# Patient Record
Sex: Female | Born: 1960 | Race: White | Hispanic: No | Marital: Married | Smoking: Former smoker
Health system: Southern US, Community
[De-identification: ages and names within clinical notes are randomized; demographics above are authoritative.]

## PROBLEM LIST (undated history)

## (undated) DIAGNOSIS — E785 Hyperlipidemia, unspecified: Secondary | ICD-10-CM

## (undated) DIAGNOSIS — M1990 Unspecified osteoarthritis, unspecified site: Secondary | ICD-10-CM

## (undated) DIAGNOSIS — R739 Hyperglycemia, unspecified: Secondary | ICD-10-CM

## (undated) DIAGNOSIS — M549 Dorsalgia, unspecified: Secondary | ICD-10-CM

## (undated) DIAGNOSIS — K5900 Constipation, unspecified: Secondary | ICD-10-CM

## (undated) DIAGNOSIS — E039 Hypothyroidism, unspecified: Secondary | ICD-10-CM

## (undated) DIAGNOSIS — E079 Disorder of thyroid, unspecified: Secondary | ICD-10-CM

## (undated) DIAGNOSIS — K219 Gastro-esophageal reflux disease without esophagitis: Secondary | ICD-10-CM

## (undated) DIAGNOSIS — F419 Anxiety disorder, unspecified: Secondary | ICD-10-CM

## (undated) DIAGNOSIS — M7989 Other specified soft tissue disorders: Secondary | ICD-10-CM

## (undated) DIAGNOSIS — M2550 Pain in unspecified joint: Secondary | ICD-10-CM

## (undated) DIAGNOSIS — E559 Vitamin D deficiency, unspecified: Secondary | ICD-10-CM

## (undated) DIAGNOSIS — F32A Depression, unspecified: Secondary | ICD-10-CM

## (undated) DIAGNOSIS — R5383 Other fatigue: Secondary | ICD-10-CM

## (undated) HISTORY — DX: Other fatigue: R53.83

## (undated) HISTORY — DX: Other specified soft tissue disorders: M79.89

## (undated) HISTORY — PX: ROUX-EN-Y PROCEDURE: SUR1287

## (undated) HISTORY — DX: Pain in unspecified joint: M25.50

## (undated) HISTORY — DX: Constipation, unspecified: K59.00

## (undated) HISTORY — DX: Hyperlipidemia, unspecified: E78.5

## (undated) HISTORY — DX: Gastro-esophageal reflux disease without esophagitis: K21.9

## (undated) HISTORY — PX: CHOLECYSTECTOMY: SHX55

## (undated) HISTORY — DX: Hyperglycemia, unspecified: R73.9

## (undated) HISTORY — DX: Hypothyroidism, unspecified: E03.9

## (undated) HISTORY — PX: OTHER SURGICAL HISTORY: SHX169

## (undated) HISTORY — DX: Depression, unspecified: F32.A

## (undated) HISTORY — DX: Anxiety disorder, unspecified: F41.9

## (undated) HISTORY — DX: Dorsalgia, unspecified: M54.9

## (undated) HISTORY — DX: Vitamin D deficiency, unspecified: E55.9

---

## 2014-02-04 HISTORY — PX: FOOT SURGERY: SHX648

## 2015-02-05 HISTORY — PX: OTHER SURGICAL HISTORY: SHX169

## 2020-10-23 ENCOUNTER — Emergency Department (HOSPITAL_COMMUNITY)
Admission: EM | Admit: 2020-10-23 | Discharge: 2020-10-23 | Disposition: A | Discharge status: Home / Self Care | Payer: BC Managed Care – PPO | Attending: Emergency Medicine | Admitting: Emergency Medicine

## 2020-10-23 ENCOUNTER — Other Ambulatory Visit: Payer: Self-pay

## 2020-10-23 ENCOUNTER — Encounter (HOSPITAL_COMMUNITY): Payer: Self-pay

## 2020-10-23 ENCOUNTER — Emergency Department (HOSPITAL_COMMUNITY): Payer: BC Managed Care – PPO

## 2020-10-23 DIAGNOSIS — Z87891 Personal history of nicotine dependence: Secondary | ICD-10-CM | POA: Insufficient documentation

## 2020-10-23 DIAGNOSIS — S0990XA Unspecified injury of head, initial encounter: Secondary | ICD-10-CM | POA: Diagnosis not present

## 2020-10-23 DIAGNOSIS — M549 Dorsalgia, unspecified: Secondary | ICD-10-CM | POA: Diagnosis not present

## 2020-10-23 DIAGNOSIS — Y9259 Other trade areas as the place of occurrence of the external cause: Secondary | ICD-10-CM | POA: Insufficient documentation

## 2020-10-23 DIAGNOSIS — M542 Cervicalgia: Secondary | ICD-10-CM | POA: Insufficient documentation

## 2020-10-23 DIAGNOSIS — M546 Pain in thoracic spine: Secondary | ICD-10-CM | POA: Diagnosis not present

## 2020-10-23 DIAGNOSIS — M25521 Pain in right elbow: Secondary | ICD-10-CM | POA: Diagnosis not present

## 2020-10-23 DIAGNOSIS — W01198A Fall on same level from slipping, tripping and stumbling with subsequent striking against other object, initial encounter: Secondary | ICD-10-CM | POA: Diagnosis not present

## 2020-10-23 DIAGNOSIS — R5383 Other fatigue: Secondary | ICD-10-CM | POA: Insufficient documentation

## 2020-10-23 DIAGNOSIS — R519 Headache, unspecified: Secondary | ICD-10-CM | POA: Diagnosis not present

## 2020-10-23 HISTORY — DX: Unspecified osteoarthritis, unspecified site: M19.90

## 2020-10-23 HISTORY — DX: Disorder of thyroid, unspecified: E07.9

## 2020-10-23 NOTE — Discharge Instructions (Addendum)
As we discussed there is no evidence of injury to your brain, your spine based on your scan today.  Expect you to have some pain secondary to your fall.  Please read the attachment for things to expect with a head injury above, please return if you begin to have any worsening symptoms including loss of consciousness, headache that does not respond to ibuprofen or Tylenol, weakness, numbness or tingling of any of your limbs, facial drooping, difficulty speaking.

## 2020-10-23 NOTE — ED Provider Notes (Signed)
[facility]-EMERGENCY DEPT Provider Note   CSN: [id number] Arrival date & time: 10/23/20  1500     History Chief Complaint  Patient presents with   Fall   Neck Pain   Back Pain    Holly Larsen is a 60 y.o. female pt complains of head, neck pain, fatigue without nausea, vomiting without vision changes, without gait abnormality secondary to a fall on Saturday at [facility].  Patient reports she slipped and fell, and hit her head and neck on the floor.  Patient was just evaluated by orthopedics, and they performed x-rays of her other and injured her limbs including her right elbow, which showed no abnormalities.  They wanted her to be further evaluated given her injury mechanism to rule out head intracranial damage.  Patient is not taking a blood thinner.     Fall  Neck Pain Back Pain     Past Medical History:  Diagnosis Date   Arthritis    Thyroid disease     There are no problems to display for this patient.   Past Surgical History:  Procedure Laterality Date   cataract surgery Left 2017   CHOLECYSTECTOMY     FOOT SURGERY Left 2016   rous-en-y     ROUX-EN-Y PROCEDURE       OB History   No obstetric history on file.     History reviewed. No pertinent family history.  Social History   Tobacco Use   Smoking status: Former    Types: Cigarettes   Smokeless tobacco: Never  Vaping Use   Vaping Use: Never used  Substance Use Topics   Alcohol use: Yes   Drug use: Not Currently    Home Medications Prior to Admission medications   Not on File    Allergies    Aleve [naproxen], Morphine and related, and Tylenol with codeine #3 [acetaminophen-codeine]  Review of Systems   Review of Systems  Musculoskeletal:  Positive for back pain and neck pain.  All other systems reviewed and are negative.  Physical Exam Updated Vital Signs BP (!) 165/93   Pulse 77   Temp 97.9 F (36.6 C) (Oral)   Resp 16   Ht 5' (1.524 m)   Wt 90.7 kg   SpO2  99%   BMI 39.06 kg/m   Physical Exam Vitals and nursing note reviewed.  Constitutional:      General: She is not in acute distress.    Appearance: Normal appearance.  HENT:     Head: Normocephalic and atraumatic.  Eyes:     General:        Right eye: No discharge.        Left eye: No discharge.  Cardiovascular:     Rate and Rhythm: Normal rate and regular rhythm.     Heart sounds: No murmur heard.   No friction rub. No gallop.  Pulmonary:     Effort: Pulmonary effort is normal.     Breath sounds: Normal breath sounds.  Abdominal:     General: Bowel sounds are normal.     Palpations: Abdomen is soft.  Musculoskeletal:     Comments: Some pain with flexion and extension of neck without decrease in range of motion. No stepoffs or midline spinal tenderness.  Skin:    General: Skin is warm and dry.     Capillary Refill: Capillary refill takes less than 2 seconds.  Neurological:     Mental Status: She is alert and oriented to person, place, and time.  Comments: CN3-12 grossly intact. Romberg negative. Normal gait. Strength 5/5 in bil upper and lower extremities.  Psychiatric:        Mood and Affect: Mood normal.        Behavior: Behavior normal.    ED Results / Procedures / Treatments   Labs (all labs ordered are listed, but only abnormal results are displayed) Labs Reviewed - No data to display  EKG None  Radiology CT HEAD WO CONTRAST ( )  Result Date: 10/23/2020 CLINICAL DATA:  60 year old female with history of head and neck pain after a fall on Saturday. Fatigue. EXAM: CT HEAD WITHOUT CONTRAST CT CERVICAL SPINE WITHOUT CONTRAST TECHNIQUE: Multidetector CT imaging of the head and cervical spine was performed following the standard protocol without intravenous contrast. Multiplanar CT image reconstructions of the cervical spine were also generated. COMPARISON:  None. FINDINGS: CT HEAD FINDINGS Brain: In the right frontal region there is a small extra-axial area of  osseous density, likely to represent either a sessile heavily calcified meningioma or focal hyperostosis of the right frontal bone. This does not exert significant mass effect upon the underlying right frontal lobe. No evidence of acute infarction, hemorrhage, hydrocephalus, extra-axial collection or other mass lesion/mass effect. Vascular: No hyperdense vessel or unexpected calcification. Skull: Normal. Negative for fracture or focal lesion. Sinuses/Orbits: No acute finding. Other: None. CT CERVICAL SPINE FINDINGS Alignment: Normal. Skull base and vertebrae: No acute fracture. No primary bone lesion or focal pathologic process. Soft tissues and spinal canal: No prevertebral fluid or swelling. No visible canal hematoma. Disc levels: Very mild multilevel degenerative disc disease and mild multilevel facet arthropathy. Upper chest: Negative. Other: None. IMPRESSION: 1. No evidence of significant acute traumatic injury to the skull, brain or cervical spine. Electronically Signed   By: Trudie Reed M.D.   On: 10/23/2020 16:34   CT Cervical Spine Wo Contrast  Result Date: 10/23/2020 CLINICAL DATA:  60 year old female with history of head and neck pain after a fall on Saturday. Fatigue. EXAM: CT HEAD WITHOUT CONTRAST CT CERVICAL SPINE WITHOUT CONTRAST TECHNIQUE: Multidetector CT imaging of the head and cervical spine was performed following the standard protocol without intravenous contrast. Multiplanar CT image reconstructions of the cervical spine were also generated. COMPARISON:  None. FINDINGS: CT HEAD FINDINGS Brain: In the right frontal region there is a small extra-axial area of osseous density, likely to represent either a sessile heavily calcified meningioma or focal hyperostosis of the right frontal bone. This does not exert significant mass effect upon the underlying right frontal lobe. No evidence of acute infarction, hemorrhage, hydrocephalus, extra-axial collection or other mass lesion/mass effect.  Vascular: No hyperdense vessel or unexpected calcification. Skull: Normal. Negative for fracture or focal lesion. Sinuses/Orbits: No acute finding. Other: None. CT CERVICAL SPINE FINDINGS Alignment: Normal. Skull base and vertebrae: No acute fracture. No primary bone lesion or focal pathologic process. Soft tissues and spinal canal: No prevertebral fluid or swelling. No visible canal hematoma. Disc levels: Very mild multilevel degenerative disc disease and mild multilevel facet arthropathy. Upper chest: Negative. Other: None. IMPRESSION: 1. No evidence of significant acute traumatic injury to the skull, brain or cervical spine. Electronically Signed   By: Trudie Reed M.D.   On: 10/23/2020 16:34    Procedures Procedures   Medications Ordered in ED Medications - No data to display  ED Course  I have reviewed the triage vital signs and the nursing notes.  Pertinent labs & imaging results that were available during my care of the patient  were reviewed by me and considered in my medical decision making (see chart for details).    MDM Rules/Calculators/A&P                         Patient had a fall involving her head and neck with worrying mechanism.  Patient was evaluated by orthopedics and told to come here for further evaluation.  Patient with some vague symptoms of possible concussion including fatigue, continued head pain, feeling "off". No nausea, amnesia, photophobia.  Radiographic imaging of head and neck without abnormality.  Discussed patient has some signs and symptoms of a mild concussion.  Patient without any focal neurological deficit at this time.  Patient was evaluated by orthopedics for her other injuries, and does not have any fractures.  Discussed that expectation to have some pain in her head and neck over the next few days.  No evidence for any traumatic brain injury, no evidence of any significant spinal injury at this time.  Discussed extensive return precautions.  Patient  discharged in stable condition. Final Clinical Impression(s) / ED Diagnoses Final diagnoses:  Injury of head, initial encounter    Rx / DC Orders ED Discharge Orders     None        Olene Floss, PA-C 10/23/20 2000    Charlynne Pander, MD 10/23/20 [phone]

## 2020-10-23 NOTE — ED Triage Notes (Signed)
Patient reports that she stepped on a wet mat 2 days ago at [employer]. Patient states that she landed on the her right side and then hit her back, posterior neck and back of her head. Patient states she had an x-ray of her back and neck and an Ortho physician today. Patient states she was told she had a concussion and told her to come to the ED. Patient was told she needed a CT scan. Ortho clinic also placed a soft collar on the patient prior to sending her to the ED.

## 2020-10-23 NOTE — ED Provider Notes (Signed)
Emergency Medicine Provider Triage Evaluation Note  Holly Larsen , a 60 y.o. female  was evaluated in triage.  Pt complains of head, neck pain, fatigue without nausea, vomiting without vision changes, without gait abnormality secondary to a fall on Saturday at [facility].  Patient reports she slipped and fell, and hit her head and neck on the floor.  Patient was just evaluated by orthopedics, and they performed x-rays of her other and injured her limbs including her right elbow, which showed no abnormalities.  They wanted her to be further evaluated given her injury mechanism to rule out head intracranial damage.  Patient is not taking a blood thinner.  Review of Systems  Positive: As above Negative: As above  Physical Exam  BP (!) 156/98 (BP Location: Left Arm)   Pulse 83   Temp 98 F (36.7 C) (Oral)   Resp 16   Ht 5' (1.524 m)   Wt 90.7 kg   SpO2 98%   BMI 39.06 kg/m  Gen:   Awake, no distress , in C-Collar Resp:  Normal effort  MSK:   Moves extremities without difficulty  Other:  CN3-12 grossly intact  Medical Decision Making  Medically screening exam initiated at 3:37 PM.  Appropriate orders placed.  Holly Larsen was informed that the remainder of the evaluation will be completed by another provider, this initial triage assessment does not replace that evaluation, and the importance of remaining in the ED until their evaluation is complete.  Head / neck trauma   [place] 10/23/20 1539    Mancel Bale, MD 10/23/20 1731

## 2020-12-14 DIAGNOSIS — Z0000 Encounter for general adult medical examination without abnormal findings: Secondary | ICD-10-CM | POA: Diagnosis not present

## 2020-12-14 DIAGNOSIS — E039 Hypothyroidism, unspecified: Secondary | ICD-10-CM | POA: Diagnosis not present

## 2020-12-14 DIAGNOSIS — N62 Hypertrophy of breast: Secondary | ICD-10-CM | POA: Diagnosis not present

## 2021-03-15 ENCOUNTER — Other Ambulatory Visit: Payer: Self-pay

## 2021-03-15 ENCOUNTER — Ambulatory Visit (INDEPENDENT_AMBULATORY_CARE_PROVIDER_SITE_OTHER): Payer: BC Managed Care – PPO | Admitting: Plastic Surgery

## 2021-03-15 VITALS — BP 145/81 | HR 85 | Ht 60.0 in | Wt 196.8 lb

## 2021-03-15 DIAGNOSIS — N62 Hypertrophy of breast: Secondary | ICD-10-CM | POA: Diagnosis not present

## 2021-03-15 DIAGNOSIS — M545 Low back pain, unspecified: Secondary | ICD-10-CM | POA: Diagnosis not present

## 2021-03-15 DIAGNOSIS — M546 Pain in thoracic spine: Secondary | ICD-10-CM | POA: Diagnosis not present

## 2021-03-15 DIAGNOSIS — M4004 Postural kyphosis, thoracic region: Secondary | ICD-10-CM

## 2021-03-15 NOTE — Progress Notes (Signed)
° °  Referring Provider Royann Shivers, PA-C [address] [place],  [place] [address]   CC:  Chief Complaint  Patient presents with   Advice Only      Holly Larsen is an 61 y.o. female.  HPI: Patient presents to discuss breast reduction.  She has had years of back pain, neck pain and shoulder grooving related to her large breast.  She is tried over-the-counter medications, warm packs, cold packs and supportive bras with little relief.  She also gets rashes beneath her breast that been refractory to over-the-counter treatments.  No family history of breast cancer no previous breast biopsies or procedures.  She does not smoke and is not a diabetic.  She is overdue for a mammogram.  She is currently a G cup and wants to be a D cup.  Allergies  Allergen Reactions   Aleve [Naproxen]    Morphine And Related    Tylenol With Codeine #3 [Acetaminophen-Codeine]     Outpatient Encounter Medications as of 03/15/2021  Medication Sig   levothyroxine (SYNTHROID) 75 MCG tablet Take 75 mcg by mouth every morning.   No facility-administered encounter medications on file as of 03/15/2021.     Past Medical History:  Diagnosis Date   Arthritis    Thyroid disease     Past Surgical History:  Procedure Laterality Date   cataract surgery Left 2017   CHOLECYSTECTOMY     FOOT SURGERY Left 2016   rous-en-y     ROUX-EN-Y PROCEDURE      No family history on file.  Social History   Social History Narrative   Not on file     Review of Systems General: Denies fevers, chills, weight loss CV: Denies chest pain, shortness of breath, palpitations  Physical Exam Vitals with BMI 03/15/2021 10/23/2020 10/23/2020  Height 5\' 0"  - -  Weight 196 lbs 13 oz - -  BMI 38.43 - -  Systolic 145 - 165  Diastolic 81 - 93  Pulse 85 77 -    General:  No acute distress,  Alert and oriented, Non-Toxic, Normal speech and affect Breast: Patient has grade 3 ptosis.  Sternal notch to nipple is 36 cm on the right and 37  cm on the left.  Nipple to fold is 20 cm on the right and 19 cm on the left.  No obvious scars or masses.  Assessment/Plan The patient has bilateral symptomatic macromastia.  She is a good candidate for a breast reduction.  She is interested in pursuing surgical treatment.  She has tried supportive garments and fitted bras with no relief.  The details of breast reduction surgery were discussed.  I explained the procedure in detail along the with the expected scars.  The risks were discussed in detail and include bleeding, infection, damage to surrounding structures, need for additional procedures, nipple loss, change in nipple sensation, persistent pain, contour irregularities and asymmetries.  I explained that breast feeding is often not possible after breast reduction surgery.  We discussed the expected postoperative course with an overall recovery period of about 1 month.  She demonstrated full understanding of all risks.  We discussed her personal risk factors that include the length of her breast..  The patient is interested in pursuing surgical treatment.  Patient realizes she will need to get a mammogram prior to surgery.  I anticipate approximately 700g of tissue removed from each side.   03/15/2021, 2:10 PM

## 2021-03-22 ENCOUNTER — Telehealth: Payer: Self-pay | Admitting: Plastic Surgery

## 2021-03-22 NOTE — Telephone Encounter (Signed)
LVM requesting call back to discuss if she has ever been to a physical therapist for back pain related to breasts. Insurance guidelines will want to see that she has tried conservative measures such as physical therapy so would like to offer her the opportunity to get that done before submitting authorization. BMI is over 27 so she may need to seek care with medical weight management before submitting authorization.

## 2021-03-30 ENCOUNTER — Telehealth: Payer: Self-pay | Admitting: Plastic Surgery

## 2021-03-30 NOTE — Telephone Encounter (Signed)
LVM second voicemail requesting call back to discuss if she has ever been to a physical therapist for back pain related to breasts. Insurance guidelines will want to see that she has tried conservative measures such as physical therapy so would like to offer her the opportunity to get that done before submitting authorization. BMI is over 27 so she may need to seek care with medical weight management before submitting authorization.

## 2021-04-04 ENCOUNTER — Other Ambulatory Visit: Payer: Self-pay | Admitting: Internal Medicine

## 2021-04-04 DIAGNOSIS — N62 Hypertrophy of breast: Secondary | ICD-10-CM | POA: Diagnosis not present

## 2021-04-04 DIAGNOSIS — H268 Other specified cataract: Secondary | ICD-10-CM | POA: Diagnosis not present

## 2021-04-04 DIAGNOSIS — E669 Obesity, unspecified: Secondary | ICD-10-CM | POA: Diagnosis not present

## 2021-04-04 DIAGNOSIS — E039 Hypothyroidism, unspecified: Secondary | ICD-10-CM | POA: Diagnosis not present

## 2021-04-04 DIAGNOSIS — Z1231 Encounter for screening mammogram for malignant neoplasm of breast: Secondary | ICD-10-CM

## 2021-04-12 ENCOUNTER — Other Ambulatory Visit: Payer: Self-pay

## 2021-04-12 ENCOUNTER — Ambulatory Visit: Payer: BC Managed Care – PPO | Attending: Plastic Surgery

## 2021-04-12 DIAGNOSIS — M542 Cervicalgia: Secondary | ICD-10-CM

## 2021-04-12 DIAGNOSIS — R293 Abnormal posture: Secondary | ICD-10-CM | POA: Insufficient documentation

## 2021-04-12 DIAGNOSIS — M546 Pain in thoracic spine: Secondary | ICD-10-CM | POA: Diagnosis not present

## 2021-04-12 DIAGNOSIS — M6281 Muscle weakness (generalized): Secondary | ICD-10-CM | POA: Insufficient documentation

## 2021-04-12 NOTE — Therapy (Signed)
?OUTPATIENT PHYSICAL THERAPY CERVICAL EVALUATION ? ? ?Patient Name: Holly BassetClaire Larsen ?MRN: [id number] ?DOB:[birth year], 61 y.o., female ?Today's Date: 04/12/2021 ? ? PT End of Session - 04/12/21 1443   ? ? Visit Number 1   ? Number of Visits 7   ? Date for PT Re-Evaluation 05/26/21   ? Authorization Type BCBS   ? PT Start Time 1443   ? PT Stop Time 1520   ? PT Time Calculation (min) 37 min   ? Activity Tolerance Patient tolerated treatment well   ? Behavior During Therapy [facility] for tasks assessed/performed   ? ?  ?  ? ?  ? ? ?Past Medical History:  ?Diagnosis Date  ? Arthritis   ? Thyroid disease   ? ?Past Surgical History:  ?Procedure Laterality Date  ? cataract surgery Left 2017  ? CHOLECYSTECTOMY    ? FOOT SURGERY Left 2016  ? rous-en-y    ? ROUX-EN-Y PROCEDURE    ? ?There are no problems to display for this patient. ? ? ?PCP: Ollen BowlPahwani, Rinka R, MD ? ?REFERRING PROVIDER: Allena NapoleonPace, Collier S, MD ? 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 ?THERAPY DIAG:  ?Cervicalgia ? ?Pain in thoracic spine ? ?Muscle weakness (generalized) ? ?Abnormal posture ? ?ONSET DATE: >5 years ? ?SUBJECTIVE:                                                                                                                                                                                                        ? ?SUBJECTIVE STATEMENT: ?Patient reports she has been experiencing pain for a long time in her neck and back secondary to her breast size. She reports the pain has been worsening over past 5 years. She reports difficulty with bending and taking a deep breath secondary to pain and breast size.  ? ?PERTINENT HISTORY:  ?N/A  ? ?PAIN:  ?Are you having pain? Yes ?NPRS scale: 3/10 ?NPRS worst: 5/10  ?Pain location: neck and mid back  ?PAIN TYPE: "heavy weight."  ?Pain description: constant   ?Aggravating factors: bending ?Relieving factors: heat  ? ?PRECAUTIONS: None ? ?WEIGHT BEARING RESTRICTIONS No ? ?FALLS:  ?Has patient fallen in last 6 months? No ?Number of falls: 0 ? ?LIVING ENVIRONMENT: ?Lives with: lives with their spouse ?Lives in: House/apartment ?Stairs: stairs to enter  ?Has following equipment at home: None ? ?OCCUPATION: Retired  ? ?PLOF: Independent ? ?PATIENT GOALS "If I can learn some  exercises that will help me strengthen my back."  ? ?OBJECTIVE:  ? ?DIAGNOSTIC FINDINGS:  ?N/A ? ?PATIENT SURVEYS:  ?N/A ? ? ?COGNITION: ?Overall cognitive status: Within functional limits for tasks assessed ?  ? ?POSTURE:  ?Forward head, rounded shoulders; increased kyphosis  ? ?PALPATION: ?TTP bilateral thoracic paraspinals, upper traps, levator scapulae   ? ?CERVICAL AROM/PROM ? ?A/PROM A/PROM (deg) ?04/12/2021  ?Flexion WNL  ?Extension 20  ?Right lateral flexion WNL  ?Left lateral flexion WNL  ?Right rotation 65  ?Left rotation 45  ? (Blank rows = not tested) ? ?UE AROM/PROM: bilateral shoulder AROM WFL  ? ? ?UE MMT: ? ?MMT Right ?04/12/2021 Left ?04/12/2021  ?Shoulder flexion 4+ 4  ?Shoulder extension    ?Shoulder abduction 5 5  ?Shoulder adduction    ?Shoulder extension    ?Shoulder internal rotation 5 5  ?Shoulder external rotation 5 5  ?Middle trapezius 3+ 3+  ?Lower trapezius    ?Elbow flexion    ?Elbow extension    ?Wrist flexion    ?Wrist extension    ?Wrist ulnar deviation    ?Wrist radial deviation    ?Wrist pronation    ?Wrist supination    ?Grip strength    ? (Blank rows = not tested) ? ? ?FUNCTIONAL TESTS:  ?DNF endurance test: 10 seconds  ? ? ?TODAY'S TREATMENT:  ?[facility] Adult PT Treatment:                                                DATE: 04/12/21 ?Therapeutic Exercise: ?Demonstrated and issue initial HEP.  ? ?Therapeutic Activity: ?Education on assessment findings that will be addressed throughout duration of POC.  ? ? ? ? ?PATIENT EDUCATION:  ?Education details: see treatment above  ?Person  educated: Patient ?Education method: Explanation, Demonstration, Tactile cues, Verbal cues, and Handouts ?Education comprehension: verbalized understanding, returned demonstration, verbal cues required, tactile cues required, and needs further education ? ? ?HOME EXERCISE PROGRAM: ?Access Code: [email or web address] ?URL: [email or web address] ?Date: 04/12/2021 ?Prepared by: Letitia Libra ? ?Exercises ?Seated Scapular Retraction - 1 x daily - 7 x weekly - 2 sets - 10 reps - 5 sec hold ?Supine Diaphragmatic Breathing - 1 x daily - 7 x weekly - 1 sets - 10 reps - 5 sec hold ?Seated Thoracic Lumbar Extension with Pectoralis Stretch - 1 x daily - 7 x weekly - 1 sets - 10 reps ?Doorway Pec Stretch at 90 Degrees Abduction - 1 x daily - 7 x weekly - 3 sets - 30 sec hold ?Seated Upper Trapezius Stretch - 1 x daily - 7 x weekly - 3 sets - 30 sec hold ? ? ?ASSESSMENT: ? ?CLINICAL IMPRESSION: ?Patient is a 61 y.o. female who was seen today for physical therapy evaluation and treatment for chronic neck and thoracic pain that has been ongoing for years that she attributes to her breast size. She reports pain is aggravated with bending and deep breathing and has progressively worsened over the past 5 years. Upon assessment she is noted to have limited cervical extension and rotation AROM, shoulder and periscapular weakness, and postural abnormalities. She will benefit from skilled PT to address the above stated deficits in order to optimize her function as she prepares for breast reduction surgery.  ? ? ? ?OBJECTIVE IMPAIRMENTS decreased ROM, decreased strength, postural dysfunction, and pain.  ? ?ACTIVITY LIMITATIONS cleaning and shopping.  ? ?  PERSONAL FACTORS Age, Fitness, and Time since onset of injury/illness/exacerbation are also affecting patient's functional outcome.  ? ? ?REHAB POTENTIAL: Good ? ?CLINICAL DECISION MAKING: Stable/uncomplicated ? ?EVALUATION COMPLEXITY: Low ? ? ?GOALS: ?Goals reviewed with patient?  No ? ?SHORT TERM GOALS: ? ?Patient will be independent and compliant with initial HEP.  ? ?Baseline: issued at eval  ?Target date: 04/26/2021 ?Goal status: INITIAL ? ? 2. Patient will demonstrate knowledge and application of appropriate sitting posture to reduce stress on her neck and back.   ?  Baseline: see posture above ?  Target Date: 05/03/21 ?  Goal Status: initial  ? ? ?LONG TERM GOALS: ? ?Patient will demonstrate at least 55 degrees of Lt cervical rotation AROM to improve ability to scan her environment.  ?Baseline: see above ?Target date: 05/24/2021 ?Goal status: INITIAL ? ?2.  Patient will demonstrate at least 40 degrees of cervical extension AROM to improve ability to complete overhead tasks.  ?Baseline: see above  ?Target date: 05/24/2021 ?Goal status: INITIAL ? ?3.  Patient will demonstrate 4/5 bilateral middle trap strength to maximize scapular stability.  ?Baseline: see above  ?Target date: 05/24/2021 ?Goal status: INITIAL ? ?4.  Patient will be independent with advanced home program to assist in management of her chronic condition.  ?Baseline: initial HEP issued.  ?Target date: 05/24/2021 ?Goal status: INITIAL ? ? ?PLAN: ?PT FREQUENCY: 1x/week ? ?PT DURATION: 6 weeks ? ?PLANNED INTERVENTIONS: Therapeutic exercises, Therapeutic activity, Neuromuscular re-education, Patient/Family education, Dry Needling, Electrical stimulation, Cryotherapy, Moist heat, Taping, and Manual therapy ? ?PLAN FOR NEXT SESSION: review HEP, posture education, periscapular strengthening  ? ?Letitia Libra, PT, DPT, ATC ?04/12/21 3:33 PM ? ? ? ? ? ?

## 2021-04-13 ENCOUNTER — Ambulatory Visit
Admission: RE | Admit: 2021-04-13 | Discharge: 2021-04-13 | Disposition: A | Discharge status: Home / Self Care | Payer: BC Managed Care – PPO | Source: Ambulatory Visit | Attending: Internal Medicine | Admitting: Internal Medicine

## 2021-04-13 DIAGNOSIS — Z1231 Encounter for screening mammogram for malignant neoplasm of breast: Secondary | ICD-10-CM | POA: Diagnosis not present

## 2021-04-21 ENCOUNTER — Encounter: Payer: Self-pay | Admitting: Plastic Surgery

## 2021-04-25 ENCOUNTER — Telehealth: Payer: Self-pay | Admitting: Plastic Surgery

## 2021-04-25 NOTE — Telephone Encounter (Signed)
Angelique Blonder w/[facility] is calling in needing to do a technical repeat on the pt and also wanted to see if the pt had another mammogram done somewhere else. ?

## 2021-04-25 NOTE — Therapy (Signed)
?OUTPATIENT PHYSICAL THERAPY TREATMENT NOTE ? ? ?Patient Name: Holly Larsen ?MRN: [id number] ?DOB:1960/08/13, 61 y.o., female ?Today's Date: 04/26/2021 ? ?PCP: Mckinley Jewel, MD ?REFERRING PROVIDER: Cindra Presume, MD ? ? PT End of Session - 04/26/21 1444   ? ? Visit Number 2   ? Number of Visits 7   ? Date for PT Re-Evaluation 05/26/21   ? Authorization Type BCBS   ? PT Start Time [id number]   ? PT Stop Time 1525   ? PT Time Calculation (min) 40 min   ? Activity Tolerance Patient tolerated treatment well   ? Behavior During Therapy [facility] for tasks assessed/performed   ? ?  ?  ? ?  ? ? ?Past Medical History:  ?Diagnosis Date  ? Arthritis   ? Thyroid disease   ? ?Past Surgical History:  ?Procedure Laterality Date  ? cataract surgery Left 2017  ? CHOLECYSTECTOMY    ? FOOT SURGERY Left 2016  ? rous-en-y    ? ROUX-EN-Y PROCEDURE    ? ?There are no problems to display for this patient. ? ? 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 ?THERAPY DIAG:  ?Cervicalgia ? ?Pain in thoracic spine ? ?Muscle weakness (generalized) ? ?Abnormal posture ? ?PERTINENT HISTORY: N/A ? ?PRECAUTIONS: None  ? ?SUBJECTIVE: "Feeling good. I have been doing my exercises. My neck doesn't feel good sometimes, but right now I am alright"  ? ?PAIN:  ?Are you having pain? No ? ? ?OBJECTIVE:  ?  ? ?POSTURE:  ?Forward head, rounded shoulders; increased kyphosis  ?  ?PALPATION: ?TTP bilateral thoracic paraspinals, upper traps, levator scapulae     ?  ?CERVICAL AROM/PROM ?  ?A/PROM A/PROM (deg) ?04/12/2021  ?Flexion WNL  ?Extension 20  ?Right lateral flexion WNL  ?Left lateral flexion WNL  ?Right rotation 65  ?Left rotation 45  ? (Blank rows = not tested) ?  ?UE AROM/PROM: bilateral shoulder AROM WFL  ?  ?  ?UE MMT: ?  ?MMT Right ?04/12/2021 Left ?04/12/2021  ?Shoulder flexion 4+ 4  ?Shoulder extension       ?Shoulder abduction 5 5  ?Shoulder adduction      ?Shoulder extension      ?Shoulder internal rotation 5 5  ?Shoulder external rotation 5 5  ?Middle trapezius 3+ 3+  ?Lower trapezius      ?Elbow flexion      ?Elbow extension      ?Wrist flexion      ?Wrist extension      ?Wrist ulnar deviation      ?Wrist radial deviation      ?Wrist pronation      ?Wrist supination      ?Grip strength      ? (Blank rows = not tested) ?  ?  ?FUNCTIONAL TESTS:  ?DNF endurance test: 10 seconds  ?  ?  ?TODAY'S TREATMENT:  ?[facility] Adult PT Treatment:                                                DATE: 04/26/21 ?Therapeutic Exercise: ?UBE level 2 x 2 min each fwd/bwd ?Scapular retraction seated 1 x 10  ?Bilateral upper trap stretch 1 x 30 sec bilateral  ?Seated thoracic extension  1 x 10  ?Sidelying thoracic rotation 1 x 10 bilateral ?Supine horizontal abduction red band 2 x 10  ?Diaphragmatic breathing 1 x 10; supine  ?Serratus punch with stability ball 2 x 10  ?Bilateral shoulder ER 2 x 10; red band  ?Pec doorway stretch 2 x 30 seconds  ? ? ?  ?  ?  ?  ?PATIENT EDUCATION:  ?Education details: N/A ?Person educated: N/A ?Education method: N/A ?Education comprehension: N/A ?  ?  ?HOME EXERCISE PROGRAM: ?Access Code: [email or web address] ?URL: [email or web address] ?Date: 04/12/2021 ?Prepared by: Gwendolyn Grant ?  ?Exercises ?Seated Scapular Retraction - 1 x daily - 7 x weekly - 2 sets - 10 reps - 5 sec hold ?Supine Diaphragmatic Breathing - 1 x daily - 7 x weekly - 1 sets - 10 reps - 5 sec hold ?Seated Thoracic Lumbar Extension with Pectoralis Stretch - 1 x daily - 7 x weekly - 1 sets - 10 reps ?Doorway Pec Stretch at 90 Degrees Abduction - 1 x daily - 7 x weekly - 3 sets - 30 sec hold ?Seated Upper Trapezius Stretch - 1 x daily - 7 x weekly - 3 sets - 30 sec hold ?  ?  ?ASSESSMENT: ?  ?CLINICAL IMPRESSION: ?Patient tolerated session well today without reports of pain. Time spent reviewing HEP with patient having difficulty with proper  muscle activation for scapular retraction requiring heavy cues initially. With continued practice she is able to properly perform. Able to progress periscapular strengthening and spinal mobility with patient requiring moderate postural cues throughout session. No changes made to HEP today with patient encouraged to continue with initially prescribed exercises at this time.  ?  ?  ?  ?OBJECTIVE IMPAIRMENTS decreased ROM, decreased strength, postural dysfunction, and pain.  ?  ?ACTIVITY LIMITATIONS cleaning and shopping.  ?  ?PERSONAL FACTORS Age, Fitness, and Time since onset of injury/illness/exacerbation are also affecting patient's functional outcome.  ?  ?  ?REHAB POTENTIAL: Good ?  ?CLINICAL DECISION MAKING: Stable/uncomplicated ?  ?EVALUATION COMPLEXITY: Low ?  ?  ?GOALS: ?Goals reviewed with patient? No ?  ?SHORT TERM GOALS: ?  ?Patient will be independent and compliant with initial HEP.  ?  ?Baseline: issued at eval  ?Target date: 04/26/2021 ?Goal status: INITIAL ?  ?          2. Patient will demonstrate knowledge and application of appropriate sitting posture to reduce stress on her neck and back.       ?                    Baseline: see posture above ?                    Target Date: 05/03/21 ?                    Goal Status: initial  ?  ?  ?LONG TERM GOALS: ?  ?Patient will demonstrate at least 55 degrees of Lt cervical rotation AROM to improve ability to scan her environment.  ?Baseline: see above ?Target date: 05/24/2021 ?Goal status: INITIAL ?  ?2.  Patient will demonstrate at least 40 degrees of cervical extension AROM to improve ability to complete overhead tasks.  ?Baseline: see above  ?Target date: 05/24/2021 ?Goal status: INITIAL ?  ?3.  Patient will demonstrate 4/5 bilateral middle trap strength to maximize scapular stability.  ?Baseline: see above  ?Target date: 05/24/2021 ?Goal status: INITIAL ?  ?4.  Patient will be  independent with advanced home program to assist in management of her chronic  condition.  ?Baseline: initial HEP issued.  ?Target date: 05/24/2021 ?Goal status: INITIAL ?  ?  ?PLAN: ?PT FREQUENCY: 1x/week ?  ?PT DURATION: 6 weeks ?  ?PLANNED INTERVENTIONS: Therapeutic exercises, Therapeutic activity, Neuromuscular re-education, Patient/Family education, Dry Needling, Electrical stimulation, Cryotherapy, Moist heat, Taping, and Manual therapy ?  ?PLAN FOR NEXT SESSION: update HEP PRN, posture education, periscapular strengthening  ?  ?Gwendolyn Grant, PT, DPT, ATC ?04/26/21 3:25 PM ? ?  ? ?

## 2021-04-25 NOTE — Telephone Encounter (Signed)
I received the information from [facility] regarding previous mammogram. Holly Larsen in [place], [place]. L/M for pt to return call so I can give her the information to fill out a ROI form on [facility] ([facility]) website. ?

## 2021-04-26 ENCOUNTER — Other Ambulatory Visit: Payer: Self-pay

## 2021-04-26 ENCOUNTER — Ambulatory Visit: Payer: BC Managed Care – PPO

## 2021-04-26 DIAGNOSIS — R293 Abnormal posture: Secondary | ICD-10-CM | POA: Diagnosis not present

## 2021-04-26 DIAGNOSIS — M542 Cervicalgia: Secondary | ICD-10-CM

## 2021-04-26 DIAGNOSIS — M6281 Muscle weakness (generalized): Secondary | ICD-10-CM

## 2021-04-26 DIAGNOSIS — M546 Pain in thoracic spine: Secondary | ICD-10-CM

## 2021-05-01 ENCOUNTER — Ambulatory Visit: Payer: BC Managed Care – PPO

## 2021-05-01 NOTE — Telephone Encounter (Signed)
LMTRC

## 2021-05-02 ENCOUNTER — Encounter: Payer: Self-pay | Admitting: Plastic Surgery

## 2021-05-08 NOTE — Therapy (Signed)
?OUTPATIENT PHYSICAL THERAPY TREATMENT NOTE ? ? ?Patient Name: Holly Larsen ?MRN: [id number] ?DOB:September 05, 1960, 61 y.o., female ?Today's Date: 05/09/2021 ? ?PCP: Ollen BowlPahwani, Rinka R, MD ?REFERRING PROVIDER: Allena NapoleonPace, Collier S, MD ? ? PT End of Session - 05/09/21 1452   ? ? Visit Number 3   ? Number of Visits 7   ? Date for PT Re-Evaluation 05/26/21   ? Authorization Type BCBS   ? PT Start Time 1552   ? PT Stop Time 1630   ? PT Time Calculation (min) 38 min   ? Activity Tolerance Patient tolerated treatment well   ? [facility] tasks assessed/performed   ? ?  ?  ? ?  ? ? ? ?Past Medical History:  ?Diagnosis Date  ? Arthritis   ? Thyroid disease   ? ?Past Surgical History:  ?Procedure Laterality Date  ? cataract surgery Left 2017  ? CHOLECYSTECTOMY    ? FOOT SURGERY Left 2016  ? rous-en-y    ? ROUX-EN-Y PROCEDURE    ? ?There are no problems to display for this patient. ? ? 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 ?THERAPY DIAG:  ?Cervicalgia ? ?Pain in thoracic spine ? ?Muscle weakness (generalized) ? ?Abnormal posture ? ?PERTINENT HISTORY: N/A ? ?PRECAUTIONS: None  ? ?SUBJECTIVE: Patient reports everything is going good. She had pain early today when bending to put on her socks and shoes which caused a costocondritis attack, but currently she denies any pain. ? ?PAIN:  ?Are you having pain? No ? ? ?OBJECTIVE:  ?POSTURE:  ?Forward head, rounded shoulders; increased kyphosis  ?  ?PALPATION: ?TTP bilateral thoracic paraspinals, upper traps, levator scapulae     ?  ?CERVICAL AROM/PROM ?  ?A/PROM A/PROM (deg) ?04/12/2021  ?Flexion WNL  ?Extension 20  ?Right lateral flexion WNL  ?Left lateral flexion WNL  ?Right rotation 65  ?Left rotation 45  ?  ?UE MMT: ?  ?MMT Right ?04/12/2021 Left ?04/12/2021  ?Shoulder flexion 4+ 4  ?Shoulder extension       ?Shoulder abduction 5 5  ?Shoulder adduction      ?Shoulder extension      ?Shoulder internal rotation 5 5  ?Shoulder external rotation 5 5  ?Middle trapezius 3+ 3+  ?Lower trapezius      ?Elbow flexion      ?Elbow extension      ?Wrist flexion      ?Wrist extension      ?Wrist ulnar deviation      ?Wrist radial deviation      ?Wrist pronation      ?Wrist supination      ?Grip strength      ? (Blank rows = not tested) ?  ?FUNCTIONAL TESTS:  ?DNF endurance test: 10 seconds  ?  ?  ?[facility]  [facility]                                                DATE: 05/09/[facility] ?Therapeutic Exercise: ?UBE L1 x 4 min (2 fwd/bwd) while taking subjective ?Doorway pec stretch 2 x 30 sec ?Sidelying thoracic rotation x 10 each ?Supine horizontal abduction with red 2 x 10 ?Supine cervical retraction 2 x 10 with 3  sec hold ?Supine diaphragmatic breathing  x 10 ?Seated double ER and scap retraction with red 2 x 10 ?Seated thoracic extension x 10 ?Row with green 2 x 10 ? ? ?[facility] Adult PT Treatment:                                                DATE: 04/26/21 ?Therapeutic Exercise: ?UBE level 2 x 2 min each fwd/bwd ?Scapular retraction seated 1 x 10  ?Bilateral upper trap stretch 1 x 30 sec bilateral  ?Seated thoracic extension 1 x 10  ?Sidelying thoracic rotation 1 x 10 bilateral ?Supine horizontal abduction red band 2 x 10  ?Diaphragmatic breathing 1 x 10; supine  ?Serratus punch with stability ball 2 x 10  ?Bilateral shoulder ER 2 x 10; red band  ?Pec doorway stretch 2 x 30 seconds  ?  ?PATIENT EDUCATION:  ?Education details: HEP update ?Person educated: Patient ?Education method: Explanation, Handout ?Education comprehension: Verbalize understand ?   ?HOME EXERCISE PROGRAM: ?Access Code: [email or web address] ?  ?  ?ASSESSMENT: ?CLINICAL IMPRESSION: ?Patient tolerated session well without adverse effects. Therapy focused on continued postural control consisting of stretching and strengthening exercises. Updated HEP to progress  mobility and strength exercises at home. She tolerated therapy well with no increased pain. She did require cueing for proper technique and posture, and coordinating her breathing exercise. Patient would benefit from continued skilled PT to progress mobility and strength in order to reduce pain and maximize functional ability. ?  ?  ?OBJECTIVE IMPAIRMENTS decreased ROM, decreased strength, postural dysfunction, and pain.  ?  ?ACTIVITY LIMITATIONS cleaning and shopping.  ?  ?PERSONAL FACTORS Age, Fitness, and Time since onset of injury/illness/exacerbation are also affecting patient's functional outcome.  ?  ?  ?GOALS: ?Goals reviewed with patient? No ?  ?SHORT TERM GOALS: ?  ?Patient will be independent and compliant with initial HEP.  ?Baseline: issued at eval  ?05/09/2021: ?Target date: 04/26/2021 ?Goal status: INITIAL ?  ?          2. Patient will demonstrate knowledge and application of appropriate sitting posture to reduce stress on her neck and back.   ?                  Baseline: see posture above ?  05/09/2021: ?                       Target Date: 05/03/21 ?                       Goal Status: initial  ?  ?  ?LONG TERM GOALS: ?  ?Patient will demonstrate at least 55 degrees of Lt cervical rotation AROM to improve ability to scan her environment.  ?Baseline: see above ?Target date: 05/24/2021 ?Goal status: INITIAL ?  ?2.  Patient will demonstrate at least 40 degrees of cervical extension AROM to improve ability to complete overhead tasks.  ?Baseline: see above  ?Target date: 05/24/2021 ?Goal status: INITIAL ?  ?3.  Patient will demonstrate 4/5 bilateral middle trap strength to maximize scapular stability.  ?Baseline: see above  ?Target date: 05/24/2021 ?Goal status: INITIAL ?  ?4.  Patient will be independent with advanced home program to assist in management of her chronic condition.  ?Baseline: initial HEP issued.  ?Target date: 05/24/2021 ?Goal status: INITIAL ?  ?  ?  PLAN: ?PT FREQUENCY: 1x/week ?  ?PT DURATION: 6  weeks ?  ?PLANNED INTERVENTIONS: Therapeutic exercises, Therapeutic activity, Neuromuscular re-education, Patient/Family education, Dry Needling, Electrical stimulation, Cryotherapy, Moist heat, Taping, and Manual therapy ?  ?PLAN FOR NEXT SESSION: update HEP PRN, posture education, periscapular strengthening  ?  ? ?Rosana Hoes, PT, DPT, LAT, ATC ?05/09/21  3:32 PM ?Phone: [phone] ?Fax: [phone] ? ? ?  ? ?

## 2021-05-09 ENCOUNTER — Encounter: Payer: Self-pay | Admitting: Physical Therapy

## 2021-05-09 ENCOUNTER — Other Ambulatory Visit: Payer: Self-pay

## 2021-05-09 ENCOUNTER — Ambulatory Visit: Payer: BC Managed Care – PPO | Attending: Plastic Surgery | Admitting: Physical Therapy

## 2021-05-09 DIAGNOSIS — M6281 Muscle weakness (generalized): Secondary | ICD-10-CM | POA: Insufficient documentation

## 2021-05-09 DIAGNOSIS — R293 Abnormal posture: Secondary | ICD-10-CM | POA: Insufficient documentation

## 2021-05-09 DIAGNOSIS — M542 Cervicalgia: Secondary | ICD-10-CM | POA: Insufficient documentation

## 2021-05-09 DIAGNOSIS — M546 Pain in thoracic spine: Secondary | ICD-10-CM | POA: Diagnosis not present

## 2021-05-09 NOTE — Patient Instructions (Signed)
Access Code: [email or web address] ?URL: [email or web address] ?Date: 05/09/2021 ?Prepared by: Rosana Hoes ? ?Exercises ?- Supine Diaphragmatic Breathing  - 1 x daily - 7 x weekly - 1 sets - 10 reps - 5 sec hold ?- Supine Shoulder Horizontal Abduction with Resistance  - 1 x daily - 7 x weekly - 3 sets - 10 reps ?- Supine Chin Tucks on Flat Ball  - 1 x daily - 7 x weekly - 2 sets - 10 reps - 3 seconds hold ?- Sidelying Upper Thoracic Rotation  - 1 x daily - 7 x weekly - 1 sets - 10 reps ?- Seated Thoracic Lumbar Extension with Pectoralis Stretch  - 1 x daily - 7 x weekly - 1 sets - 10 reps ?- Seated Upper Trapezius Stretch  - 1 x daily - 7 x weekly - 3 sets - 30 sec  hold ?- Shoulder External Rotation and Scapular Retraction with Resistance  - 1 x daily - 7 x weekly - 2 sets - 10 reps ?- Standing Row with Anchored Resistance  - 1 x daily - 7 x weekly - 2 sets - 10 reps ?- Doorway Pec Stretch at 90 Degrees Abduction  - 1 x daily - 7 x weekly - 3 sets - 30 sec  hold ?

## 2021-05-14 ENCOUNTER — Encounter: Payer: BC Managed Care – PPO | Admitting: Physical Therapy

## 2021-05-14 NOTE — Therapy (Incomplete)
?OUTPATIENT PHYSICAL THERAPY TREATMENT NOTE ? ? ?Patient Name: Holly Larsen ?MRN: [id number] ?DOB:11-21-1960, 61 y.o., female ?Today's Date: 05/14/2021 ? ?PCP: Ollen BowlPahwani, Rinka R, MD ?REFERRING PROVIDER: Ollen BowlPahwani, Rinka R, MD ? ? ? ? ? ?Past Medical History:  ?Diagnosis Date  ? Arthritis   ? Thyroid disease   ? ?Past Surgical History:  ?Procedure Laterality Date  ? cataract surgery Left 2017  ? CHOLECYSTECTOMY    ? FOOT SURGERY Left 2016  ? rous-en-y    ? ROUX-EN-Y PROCEDURE    ? ?There are no problems to display for this patient. ? ? 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 ?THERAPY DIAG:  ?No diagnosis found. ? ?PERTINENT HISTORY: N/A ? ?PRECAUTIONS: None  ? ?SUBJECTIVE: Patient reports everything is going good. She had pain early today when bending to put on her socks and shoes which caused a costocondritis attack, but currently she denies any pain. ? ?PAIN:  ?Are you having pain? No ? ? ?OBJECTIVE:  ?POSTURE:  ?Forward head, rounded shoulders; increased kyphosis  ?  ?PALPATION: ?TTP bilateral thoracic paraspinals, upper traps, levator scapulae     ?  ?CERVICAL AROM/PROM ?  ?A/PROM A/PROM (deg) ?04/12/2021  ?Flexion WNL  ?Extension 20  ?Right lateral flexion WNL  ?Left lateral flexion WNL  ?Right rotation 65  ?Left rotation 45  ?  ?UE MMT: ?  ?MMT Right ?04/12/2021 Left ?04/12/2021  ?Shoulder flexion 4+ 4  ?Shoulder extension      ?Shoulder abduction 5 5  ?Shoulder adduction      ?Shoulder extension      ?Shoulder internal rotation 5 5  ?Shoulder external rotation 5 5  ?Middle trapezius 3+ 3+  ?Lower trapezius      ?Elbow flexion      ?Elbow extension      ?Wrist flexion      ?Wrist extension      ?Wrist ulnar deviation      ?Wrist radial deviation      ?Wrist pronation      ?Wrist supination      ?Grip strength      ? (Blank rows = not tested) ?   ?FUNCTIONAL TESTS:  ?DNF endurance test: 10 seconds  ?  ?  ?[facility]  [facility]                                                [facility]: 05[facility]14/21 ?Therapeutic Exercise: ?UBE L1 x 4 min (2 fwd/bwd) while taking subjective ?Doorway pec stretch 2 x 30 sec ?Sidelying thoracic rotation x 10 each ?Supine horizontal abduction with red 2 x 10 ?Supine cervical retraction 2 x 10 with 3 sec hold ?Supine diaphragmatic breathing  x 10 ?Seated double ER and scap retraction with red 2 x 10 ?Seated thoracic extension x 10 ?Row with green 2 x 10 ? ? ?[facility]                                                DATE: 05/09/21 ?Therapeutic Exercise: ?UBE L1 x 4 min (2 fwd/bwd) while taking subjective ?  Doorway pec stretch 2 x 30 sec ?Sidelying thoracic rotation x 10 each ?Supine horizontal abduction with red 2 x 10 ?Supine cervical retraction 2 x 10 with 3 sec hold ?Supine diaphragmatic breathing  x 10 ?Seated double ER and scap retraction with red 2 x 10 ?Seated thoracic extension x 10 ?Row with green 2 x 10 ? ?[facility] Adult PT Treatment:                                                DATE: 04/26/21 ?Therapeutic Exercise: ?UBE level 2 x 2 min each fwd/bwd ?Scapular retraction seated 1 x 10  ?Bilateral upper trap stretch 1 x 30 sec bilateral  ?Seated thoracic extension 1 x 10  ?Sidelying thoracic rotation 1 x 10 bilateral ?Supine horizontal abduction red band 2 x 10  ?Diaphragmatic breathing 1 x 10; supine  ?Serratus punch with stability ball 2 x 10  ?Bilateral shoulder ER 2 x 10; red band  ?Pec doorway stretch 2 x 30 seconds  ?  ?PATIENT EDUCATION:  ?Education details: HEP update ?Person educated: Patient ?Education method: Explanation, Handout ?Education comprehension: Verbalize understand ?   ?HOME EXERCISE PROGRAM: ?Access Code: [email or web address] ?  ?  ?ASSESSMENT: ?CLINICAL IMPRESSION: ?Patient tolerated session well without adverse effects. ***  ? ?Therapy focused on continued postural control consisting of  stretching and strengthening exercises. Updated HEP to progress mobility and strength exercises at home. She tolerated therapy well with no increased pain. She did require cueing for proper technique and posture, and coordinating her breathing exercise.  ?  ?  ?OBJECTIVE IMPAIRMENTS decreased ROM, decreased strength, postural dysfunction, and pain.  ?  ?ACTIVITY LIMITATIONS cleaning and shopping.  ?  ?PERSONAL FACTORS Age, Fitness, and Time since onset of injury/illness/exacerbation are also affecting patient's functional outcome.  ?  ?  ?GOALS: ?Goals reviewed with patient? No ?  ?SHORT TERM GOALS: ?  ?Patient will be independent and compliant with initial HEP.  ?Baseline: issued at eval  ?05/09/2021: ?Target date: 04/26/2021 ?Goal status: INITIAL ?  ?          2. Patient will demonstrate knowledge and application of appropriate sitting posture to reduce stress on her neck and back.   ?                  Baseline: see posture above ?  05/09/2021: ?                       Target Date: 05/03/21 ?                       Goal Status: initial  ?  ?  ?LONG TERM GOALS: ?  ?Patient will demonstrate at least 55 degrees of Lt cervical rotation AROM to improve ability to scan her environment.  ?Baseline: see above ?Target date: 05/24/2021 ?Goal status: INITIAL ?  ?2.  Patient will demonstrate at least 40 degrees of cervical extension AROM to improve ability to complete overhead tasks.  ?Baseline: see above  ?Target date: 05/24/2021 ?Goal status: INITIAL ?  ?3.  Patient will demonstrate 4/5 bilateral middle trap strength to maximize scapular stability.  ?Baseline: see above  ?Target date: 05/24/2021 ?Goal status: INITIAL ?  ?4.  Patient will be independent with advanced home program to assist in management of her chronic condition.  ?Baseline:  initial HEP issued.  ?Target date: 05/24/2021 ?Goal status: INITIAL ?  ?  ?PLAN: ?PT FREQUENCY: 1x/week ?  ?PT DURATION: 6 weeks ?  ?PLANNED INTERVENTIONS: Therapeutic exercises, Therapeutic  activity, Neuromuscular re-education, Patient/Family education, Dry Needling, Electrical stimulation, Cryotherapy, Moist heat, Taping, and Manual therapy ?  ?PLAN FOR NEXT SESSION: update HEP PRN, posture education, periscapular strengthening  ?  ? ?Rosana Hoes, PT, DPT, LAT, ATC ?05/14/21  9:20 AM ?Phone: [phone] ?Fax: [phone] ? ? ?  ? ?

## 2021-05-15 NOTE — Therapy (Signed)
?OUTPATIENT PHYSICAL THERAPY TREATMENT NOTE ? ? ?Patient Name: Holly Larsen ?MRN: [id number] ?DOB:1960-07-12, 61 y.o., female ?Today's Date: 05/16/2021 ? ?PCP: Mckinley Jewel, MD ?REFERRING PROVIDER: Cindra Presume, MD ? ? PT End of Session - 05/16/21 1450   ? ? Visit Number 4   ? Number of Visits 7   ? Date for PT Re-Evaluation 05/26/21   ? Authorization Type BCBS   ? PT Start Time 7948   ? PT Stop Time 1525   ? PT Time Calculation (min) 40 min   ? Activity Tolerance Patient tolerated treatment well   ? Behavior During Therapy [facility] for tasks assessed/performed   ? ?  ?  ? ?  ? ? ? ? ?Past Medical History:  ?Diagnosis Date  ? Arthritis   ? Thyroid disease   ? ?Past Surgical History:  ?Procedure Laterality Date  ? cataract surgery Left 2017  ? CHOLECYSTECTOMY    ? FOOT SURGERY Left 2016  ? rous-en-y    ? ROUX-EN-Y PROCEDURE    ? ?There are no problems to display for this patient. ? ? 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 ?THERAPY DIAG:  ?Cervicalgia ? ?Pain in thoracic spine ? ?Muscle weakness (generalized) ? ?Abnormal posture ? ?PERTINENT HISTORY: N/A ? ?PRECAUTIONS: None  ? ?SUBJECTIVE: Patient reports she is doing well. The new exercises are good and do not cause any increased pain in her chest bone. ? ?PAIN:  ?Are you having pain? No ? ? ?OBJECTIVE:  ?POSTURE:  ?Forward head, rounded shoulders; increased kyphosis  ?  ?PALPATION: ?TTP bilateral thoracic paraspinals, upper traps, levator scapulae     ?  ?CERVICAL AROM/PROM ?  ?A/PROM A/PROM (deg) ?04/12/2021  ? ?05/16/2021  ?Flexion WNL   ?Extension 20   ?Right lateral flexion WNL   ?Left lateral flexion WNL   ?Right rotation 65 WNL  ?Left rotation 45 WNL  ?  ?UE MMT: ?  ?MMT Right ?04/12/2021 Left ?04/12/2021  ?Shoulder flexion 4+ 4  ?Shoulder extension      ?Shoulder abduction 5 5  ?Shoulder adduction       ?Shoulder extension      ?Shoulder internal rotation 5 5  ?Shoulder external rotation 5 5  ?Middle trapezius 3+ 3+  ?Lower trapezius      ?Elbow flexion      ?Elbow extension      ?Wrist flexion      ?Wrist extension      ?Wrist ulnar deviation      ?Wrist radial deviation      ?Wrist pronation      ?Wrist supination      ?Grip strength      ? (Blank rows = not tested) ?  ?FUNCTIONAL TESTS:  ?DNF endurance test: 10 seconds  ?  ?  ?TODAY'S TREATMENT:  ?[facility] Adult PT Treatment:                                                DATE: 05/16/21 ?Therapeutic Exercise: ?UBE L1 x 4 min (2 fwd/bwd) while taking subjective ?Sidelying thoracic rotation x 10 each ?Supine horizontal abduction with green 2 x 15 ?Supine cervical retraction x 10 with 3 sec hold ?Seated double ER and scap  retraction with green 2 x 15 ?Row with green 2 x 15 ?Extension and scap retraction with green 2 x 15 ?Seated thoracic extension over chair x 10 ?Doorway pec stretch 2 x 30 sec ?Standing thoracic extension stretch at FM bar x 10 ? ? ?[facility] Adult PT Treatment:                                                DATE: 05/09/21 ?Therapeutic Exercise: ?UBE L1 x 4 min (2 fwd/bwd) while taking subjective ?Doorway pec stretch 2 x 30 sec ?Sidelying thoracic rotation x 10 each ?Supine horizontal abduction with red 2 x 10 ?Supine cervical retraction 2 x 10 with 3 sec hold ?Supine diaphragmatic breathing  x 10 ?Seated double ER and scap retraction with red 2 x 10 ?Seated thoracic extension x 10 ?Row with green 2 x 10 ? ?[facility] Adult PT Treatment:                                                DATE: 04/26/21 ?Therapeutic Exercise: ?UBE level 2 x 2 min each fwd/bwd ?Scapular retraction seated 1 x 10  ?Bilateral upper trap stretch 1 x 30 sec bilateral  ?Seated thoracic extension 1 x 10  ?Sidelying thoracic rotation 1 x 10 bilateral ?Supine horizontal abduction red band 2 x 10  ?Diaphragmatic breathing 1 x 10; supine  ?Serratus punch with stability ball 2 x 10  ?Bilateral  shoulder ER 2 x 10; red band  ?Pec doorway stretch 2 x 30 seconds  ?  ?PATIENT EDUCATION:  ?Education details: HEP ?Person educated: Patient ?Education method: Explanation ?Education comprehension: Verbalize understand ?   ?HOME EXERCISE PROGRAM: ?Access Code: [email or web address] ?  ?  ?ASSESSMENT: ?CLINICAL IMPRESSION: ?Patient tolerated session well without adverse effects. She demonstrates improved cervical motion this visit. Therapy continues to focus primarily on spinal mobility and progression of postural strength and endurance. She seems to be progressing well with therapy and is tolerating higher resistance and repetitions with exercises. She does require occasional cueing for posture with exercises and avoiding shoulder shrug. Patient would benefit from continued skilled PT to progress her mobility and strength in order to improve postural control and reduce pain. ?  ?  ?OBJECTIVE IMPAIRMENTS decreased ROM, decreased strength, postural dysfunction, and pain.  ?  ?ACTIVITY LIMITATIONS cleaning and shopping.  ?  ?PERSONAL FACTORS Age, Fitness, and Time since onset of injury/illness/exacerbation are also affecting patient's functional outcome.  ?  ?  ?GOALS: ?Goals reviewed with patient? No ?  ?SHORT TERM GOALS: ?  ?Patient will be independent and compliant with initial HEP.  ?Baseline: issued at eval  ?05/16/2021: independent ?Target date: 04/26/2021 ?Goal status: MET ?  ?          2. Patient will demonstrate knowledge and application of appropriate sitting posture to reduce stress on her neck and back.   ?                  Baseline: see posture above ?  05/16/2021:patient able to demo appropriate posture ?                       Target Date: 05/03/21 ?  Goal Status: MET ?   ?LONG TERM GOALS: ?  ?Patient will demonstrate at least 55 degrees of Lt cervical rotation AROM to improve ability to scan her environment.  ?Baseline: see above ?Target date: 05/24/2021 ?Goal status: INITIAL ?  ?2.  Patient will  demonstrate at least 40 degrees of cervical extension AROM to improve ability to complete overhead tasks.  ?Baseline: see above  ?Target date: 05/24/2021 ?Goal status: INITIAL ?  ?3.  Patient will demonstrate 4/5 bilateral middle trap strength to maximize scapular stability.  ?Baseline: see above  ?Target date: 05/24/2021 ?Goal status: INITIAL ?  ?4.  Patient will be independent with advanced home program to assist in management of her chronic condition.  ?Baseline: initial HEP issued.  ?Target date: 05/24/2021 ?Goal status: INITIAL ?  ?  ?PLAN: ?PT FREQUENCY: 1x/week ?  ?PT DURATION: 6 weeks ?  ?PLANNED INTERVENTIONS: Therapeutic exercises, Therapeutic activity, Neuromuscular re-education, Patient/Family education, Dry Needling, Electrical stimulation, Cryotherapy, Moist heat, Taping, and Manual therapy ?  ?PLAN FOR NEXT SESSION: update HEP PRN, posture education, periscapular strengthening  ?  ? ?Hilda Blades, PT, DPT, LAT, ATC ?05/16/21  3:27 PM ?Phone: [phone] ?Fax: [phone] ? ? ?  ? ?

## 2021-05-16 ENCOUNTER — Encounter: Payer: Self-pay | Admitting: Physical Therapy

## 2021-05-16 ENCOUNTER — Other Ambulatory Visit: Payer: Self-pay

## 2021-05-16 ENCOUNTER — Ambulatory Visit: Payer: BC Managed Care – PPO | Admitting: Physical Therapy

## 2021-05-16 DIAGNOSIS — M542 Cervicalgia: Secondary | ICD-10-CM | POA: Diagnosis not present

## 2021-05-16 DIAGNOSIS — R293 Abnormal posture: Secondary | ICD-10-CM

## 2021-05-16 DIAGNOSIS — M6281 Muscle weakness (generalized): Secondary | ICD-10-CM | POA: Diagnosis not present

## 2021-05-16 DIAGNOSIS — M546 Pain in thoracic spine: Secondary | ICD-10-CM

## 2021-05-17 DIAGNOSIS — H2511 Age-related nuclear cataract, right eye: Secondary | ICD-10-CM | POA: Diagnosis not present

## 2021-05-17 DIAGNOSIS — H26492 Other secondary cataract, left eye: Secondary | ICD-10-CM | POA: Diagnosis not present

## 2021-05-17 DIAGNOSIS — H04123 Dry eye syndrome of bilateral lacrimal glands: Secondary | ICD-10-CM | POA: Diagnosis not present

## 2021-05-17 DIAGNOSIS — Z961 Presence of intraocular lens: Secondary | ICD-10-CM | POA: Diagnosis not present

## 2021-05-23 ENCOUNTER — Encounter: Payer: BC Managed Care – PPO | Admitting: Physical Therapy

## 2021-05-29 NOTE — Therapy (Signed)
?OUTPATIENT PHYSICAL THERAPY TREATMENT NOTE ? ? ?Patient Name: Holly Larsen ?MRN: [id number] ?DOB:02/26/1960, 61 y.o., female ?Today's Date: 05/30/2021 ? ?PCP: Mckinley Jewel, MD ?REFERRING PROVIDER: Mckinley Jewel, MD ? ? ? PT End of Session - 05/30/21 1453   ? ? Visit Number 5   ? Number of Visits 7   ? Date for PT Re-Evaluation 06/13/21   ? Authorization Type BCBS   ? PT Start Time 1452   ? PT Stop Time 1530   ? PT Time Calculation (min) 38 min   ? Activity Tolerance Patient tolerated treatment well   ? Behavior During Therapy [facility] for tasks assessed/performed   ? ?  ?  ? ?  ? ? ? ? ?Past Medical History:  ?Diagnosis Date  ? Arthritis   ? Thyroid disease   ? ?Past Surgical History:  ?Procedure Laterality Date  ? cataract surgery Left 2017  ? CHOLECYSTECTOMY    ? FOOT SURGERY Left 2016  ? rous-en-y    ? ROUX-EN-Y PROCEDURE    ? ?There are no problems to display for this patient. ? ? ?REFERRING DIAG: Hypertrophy of breast, Postural kyphosis, thoracic region, Low back pain, unspecified, Pain in thoracic spine  ? ?THERAPY DIAG:  ?Cervicalgia ? ?Pain in thoracic spine ? ?Muscle weakness (generalized) ? ?Abnormal posture ? ?PERTINENT HISTORY: N/A ? ?PRECAUTIONS: None  ? ?SUBJECTIVE: Patient reports the exercises are helping a lot. She has noticed she is able to breath and sleep better.  ? ?PAIN:  ?Are you having pain? No ? ? ?OBJECTIVE:  ?POSTURE:  ?Forward head, rounded shoulders; increased kyphosis  ?  ?PALPATION: ?TTP bilateral thoracic paraspinals, upper traps, levator scapulae     ?  ?CERVICAL AROM/PROM ?  ?A/PROM A/PROM (deg) ?04/12/2021  ? ?05/30/2021  ?Flexion WNL   ?Extension 20 45  ?Right lateral flexion WNL   ?Left lateral flexion WNL   ?Right rotation 65 70+  ?Left rotation 45 70+  ?  ?UE MMT: ?  ?MMT Right ?04/12/2021 Left ?04/12/2021 Rt / Lt ?05/30/2021  ?Shoulder flexion 4+ 4   ?Shoulder extension       ?Shoulder abduction 5 5   ?Shoulder adduction       ?Shoulder extension       ?Shoulder internal rotation  5 5   ?Shoulder external rotation 5 5   ?Middle trapezius 3+ 3+ 4- / 4-  ?Lower trapezius       ?Elbow flexion       ?Elbow extension       ?Wrist flexion       ?Wrist extension       ?Wrist ulnar deviation       ?Wrist radial deviation       ?Wrist pronation       ?Wrist supination       ?Grip strength       ?  ?FUNCTIONAL TESTS:  ?DNF endurance test: 10 seconds  ?  ?  ?TODAY'S TREATMENT:  ?[facility] Adult PT Treatment:                                                DATE: 05/30/21 ?Therapeutic Exercise: ?UBE L1 x 4 min (2 fwd/bwd) while taking subjective ?Seated upper trap and levator scap stretch x 20 sec each ?Sidelying thoracic rotation x 10 each ?Doorway pec stretch 3 x 20  sec ?Seated thoracic extension over chair x 10 ?Supine horizontal abduction with blue 2 x 10 ?Supine cervical retraction x 10 with 3 sec hold ?Seated double ER and scap retraction with blue 2 x 10 ?Row with blue 2 x 10 ?Extension and scap retraction with green 2 x 10 ? ? ?[facility] Adult PT Treatment:                                                DATE: 05/16/21 ?Therapeutic Exercise: ?UBE L1 x 4 min (2 fwd/bwd) while taking subjective ?Sidelying thoracic rotation x 10 each ?Supine horizontal abduction with green 2 x 15 ?Supine cervical retraction x 10 with 3 sec hold ?Seated double ER and scap retraction with green 2 x 15 ?Row with green 2 x 15 ?Extension and scap retraction with green 2 x 15 ?Seated thoracic extension over chair x 10 ?Doorway pec stretch 2 x 30 sec ?Standing thoracic extension stretch at FM bar x 10 ? ?[facility] Adult PT Treatment:                                                DATE: 05/09/21 ?Therapeutic Exercise: ?UBE L1 x 4 min (2 fwd/bwd) while taking subjective ?Doorway pec stretch 2 x 30 sec ?Sidelying thoracic rotation x 10 each ?Supine horizontal abduction with red 2 x 10 ?Supine cervical retraction 2 x 10 with 3 sec hold ?Supine diaphragmatic breathing  x 10 ?Seated double ER and scap retraction with red 2 x 10 ?Seated thoracic  extension x 10 ?Row with green 2 x 10 ?  ?PATIENT EDUCATION:  ?Education details: HEP update ?Person educated: Patient ?Education method: Explanation, Handout ?Education comprehension: Verbalize understand ?   ?HOME EXERCISE PROGRAM: ?Access Code: [email or web address] ?  ?  ?ASSESSMENT: ?CLINICAL IMPRESSION: ?Patient tolerated session well without adverse effects. She demonstrates much improve cervical motion and strength, but does continue to exhibit gross periscapular strength deficit leading to difficulty with postural control. Therapy continues to focus on cervicothoracic mobility and postural strength progression. She is progressing well and tolerating higher resistance with exercises. Updated HEP this visit to continue strength progression at home. Patient would benefit from continued skilled PT to progress her mobility and strength in order to improve postural control and reduce pain. ?  ?  ?OBJECTIVE IMPAIRMENTS decreased ROM, decreased strength, postural dysfunction, and pain.  ?  ?ACTIVITY LIMITATIONS cleaning and shopping.  ?  ?PERSONAL FACTORS Age, Fitness, and Time since onset of injury/illness/exacerbation are also affecting patient's functional outcome.  ?  ?  ?GOALS: ?Goals reviewed with patient? No ?  ?SHORT TERM GOALS: ?  ?Patient will be independent and compliant with initial HEP.  ?Baseline: issued at eval  ?05/16/2021: independent ?Target date: 04/26/2021 ?Goal status: MET ?  ?          2. Patient will demonstrate knowledge and application of appropriate sitting posture to reduce stress on her neck and back.   ?                  Baseline: see posture above ?  05/16/2021:patient able to demo appropriate posture ?  Target Date: 05/03/21 ?                       Goal Status: MET ?   ?LONG TERM GOALS: ?  ?Patient will demonstrate at least 55 degrees of Lt cervical rotation AROM to improve ability to scan her environment.  ?Baseline: see above ?05/30/2021: patient demonstrates cervical rotation  70+ deg bilaterally ?Target date: 05/24/2021 ?Goal status: MET ?  ?2.  Patient will demonstrate at least 40 degrees of cervical extension AROM to improve ability to complete overhead tasks.  ?Baseline: see above  ?05/30/2021: 45 deg ?Target date: 05/24/2021 ?Goal status: MET ?  ?3.  Patient will demonstrate 4/5 bilateral middle trap strength to maximize scapular stability.  ?Baseline: see above  ?05/30/2021: grossly 4-/5 MMT ?Target date: 05/24/2021 ?Goal status: PARTIALLY MET ?  ?4.  Patient will be independent with advanced home program to assist in management of her chronic condition.  ?Baseline: initial HEP issued.  ?05/30/2021: progressing HEP ?Target date: 05/24/2021 ?Goal status: ONGOING ?  ?  ?PLAN: ?PT FREQUENCY: 1x/week ?  ?PT DURATION: 2 weeks ?  ?PLANNED INTERVENTIONS: Therapeutic exercises, Therapeutic activity, Neuromuscular re-education, Patient/Family education, Dry Needling, Electrical stimulation, Cryotherapy, Moist heat, Taping, and Manual therapy ?  ?PLAN FOR NEXT SESSION: update HEP PRN, posture education, periscapular strengthening  ?  ? ?Hilda Blades, PT, DPT, LAT, ATC ?05/30/21  3:31 PM ?Phone: [phone] ?Fax: [phone] ? ? ?  ? ?

## 2021-05-30 ENCOUNTER — Other Ambulatory Visit: Payer: Self-pay

## 2021-05-30 ENCOUNTER — Ambulatory Visit: Payer: BC Managed Care – PPO | Admitting: Physical Therapy

## 2021-05-30 ENCOUNTER — Encounter: Payer: Self-pay | Admitting: Physical Therapy

## 2021-05-30 DIAGNOSIS — M546 Pain in thoracic spine: Secondary | ICD-10-CM | POA: Diagnosis not present

## 2021-05-30 DIAGNOSIS — M6281 Muscle weakness (generalized): Secondary | ICD-10-CM

## 2021-05-30 DIAGNOSIS — M542 Cervicalgia: Secondary | ICD-10-CM | POA: Diagnosis not present

## 2021-05-30 DIAGNOSIS — R293 Abnormal posture: Secondary | ICD-10-CM | POA: Diagnosis not present

## 2021-05-30 NOTE — Patient Instructions (Signed)
Access Code: [email or web address] ?URL: [email or web address] ?Date: 05/30/2021 ?Prepared by: Rosana Hoes ? ?Exercises ?- Supine Diaphragmatic Breathing  - 1 x daily - 10 reps - 5 sec hold ?- Supine Shoulder Horizontal Abduction with Resistance  - 1 x daily - 2 sets - 10 reps ?- Supine Chin Tucks on Flat Ball  - 1 x daily - 2 sets - 10 reps - 3 seconds hold ?- Sidelying Upper Thoracic Rotation  - 1 x daily - 10 reps ?- Seated Thoracic Lumbar Extension with Pectoralis Stretch  - 1 x daily - 10 reps ?- Seated Upper Trapezius Stretch  - 1 x daily - 3 reps - 30 sec  hold ?- Seated Levator Scapulae Stretch  - 1 x daily - 3 reps - 30 seconds hold ?- Shoulder External Rotation and Scapular Retraction with Resistance  - 1 x daily - 2 sets - 10 reps ?- Standing Row with Anchored Resistance  - 1 x daily - 2 sets - 10 reps ?- Scapular Retraction with Resistance Advanced  - 1 x daily - 2 sets - 10 reps ?- Doorway Pec Stretch at 90 Degrees Abduction  - 1 x daily - 3 reps - 30 sec  hold ?

## 2021-06-05 NOTE — Therapy (Signed)
?OUTPATIENT PHYSICAL THERAPY TREATMENT NOTE ? ?DISCHARGE ? ? ?Patient Name: Holly Larsen ?MRN: [id number] ?DOB:15-Oct-1960, 61 y.o., female ?Today's Date: 06/06/2021 ? ?PCP: Mckinley Jewel, MD ?REFERRING PROVIDER: Cindra Presume, MD ? ? ? PT End of Session - 06/06/21 1455   ? ? Visit Number 6   ? Number of Visits 7   ? Date for PT Re-Evaluation 06/13/21   ? Authorization Type BCBS   ? PT Start Time 8250   ? PT Stop Time 1531   ? PT Time Calculation (min) 38 min   ? Activity Tolerance Patient tolerated treatment well   ? Behavior During Therapy [facility] for tasks assessed/performed   ? ?  ?  ? ?  ? ? ? ? ? ?Past Medical History:  ?Diagnosis Date  ? Arthritis   ? Thyroid disease   ? ?Past Surgical History:  ?Procedure Laterality Date  ? cataract surgery Left 2017  ? CHOLECYSTECTOMY    ? FOOT SURGERY Left 2016  ? rous-en-y    ? ROUX-EN-Y PROCEDURE    ? ?There are no problems to display for this patient. ? ? ?REFERRING DIAG: Hypertrophy of breast, Postural kyphosis, thoracic region, Low back pain, unspecified, Pain in thoracic spine  ? ?THERAPY DIAG:  ?Cervicalgia ? ?Pain in thoracic spine ? ?Muscle weakness (generalized) ? ?Abnormal posture ? ?PERTINENT HISTORY: N/A ? ?PRECAUTIONS: None  ? ?SUBJECTIVE: Patient reports she continues to do well. She feels independent with her exercises and is consistent. ? ?PAIN:  ?Are you having pain? No ? ? ?OBJECTIVE:  ?POSTURE:  ?Forward head, rounded shoulders; increased kyphosis  ?  ?PALPATION: ?TTP bilateral thoracic paraspinals, upper traps, levator scapulae     ?  ?CERVICAL AROM/PROM ?  ?A/PROM A/PROM (deg) ?04/12/2021  ? ?05/30/2021  ?Flexion WNL   ?Extension 20 45  ?Right lateral flexion WNL   ?Left lateral flexion WNL   ?Right rotation 65 70+  ?Left rotation 45 70+  ?  ?UE MMT: ?  ?MMT Right ?04/12/2021 Left ?04/12/2021 Rt / Lt ?05/30/2021 Rt / Lt ?06/06/2021  ?Shoulder flexion 4+ 4    ?Shoulder extension        ?Shoulder abduction 5 5    ?Shoulder adduction        ?Shoulder extension         ?Shoulder internal rotation 5 5    ?Shoulder external rotation 5 5    ?Middle trapezius 3+ 3+ 4- / 4- 4- / 4-  ?Lower trapezius        ?Elbow flexion        ?Elbow extension        ?Wrist flexion        ?Wrist extension        ?Wrist ulnar deviation        ?Wrist radial deviation        ?Wrist pronation        ?Wrist supination        ?Grip strength        ?  ?FUNCTIONAL TESTS:  ?DNF endurance test: 10 seconds  ?  ?  ?TODAY'S TREATMENT:  ?[place] Adult PT Treatment:                                                DATE: 06/06/21 ?Therapeutic Exercise: ?UBE L1 x 4 min (2 fwd/bwd) while taking subjective ?  Doorway pec stretch 3 x 20 sec ?Seated thoracic extension over chair x 10 ?Seated upper trap and levator scap stretch x 20 sec each ?Seated double ER and scap retraction with blue 2 x 10 ?Supine horizontal abduction with blue 2 x 10 ?Supine cervical retraction x 10 with 3 sec hold ?Sidelying thoracic rotation x 10 each ?Row with blue 2 x 10 ?Extension and scap retraction with green 2 x 10 ? ? ?[facility] Adult PT Treatment:                                                DATE: 05/30/21 ?Therapeutic Exercise: ?UBE L1 x 4 min (2 fwd/bwd) while taking subjective ?Seated upper trap and levator scap stretch x 20 sec each ?Sidelying thoracic rotation x 10 each ?Doorway pec stretch 3 x 20 sec ?Seated thoracic extension over chair x 10 ?Supine horizontal abduction with blue 2 x 10 ?Supine cervical retraction x 10 with 3 sec hold ?Seated double ER and scap retraction with blue 2 x 10 ?Row with blue 2 x 10 ?Extension and scap retraction with green 2 x 10 ? ?[facility] Adult PT Treatment:                                                DATE: 05/16/21 ?Therapeutic Exercise: ?UBE L1 x 4 min (2 fwd/bwd) while taking subjective ?Sidelying thoracic rotation x 10 each ?Supine horizontal abduction with green 2 x 15 ?Supine cervical retraction x 10 with 3 sec hold ?Seated double ER and scap retraction with green 2 x 15 ?Row with green 2 x 15 ?Extension  and scap retraction with green 2 x 15 ?Seated thoracic extension over chair x 10 ?Doorway pec stretch 2 x 30 sec ?Standing thoracic extension stretch at FM bar x 10 ?  ?PATIENT EDUCATION:  ?Education details: POC discharge to follow-up with referring provider, HEP finalization ?Person educated: Patient ?Education method: Explanation ?Education comprehension: Verbalize understand ?   ?HOME EXERCISE PROGRAM: ?Access Code: [email or web address] ?  ?  ?ASSESSMENT: ?CLINICAL IMPRESSION: ?Patient tolerated session well without adverse effects. Patient has made great progress with PT, achieving all established goals except her strength goal which she has improved in and has exercises to continue progressing her postural strength and endurance. She will be formally discharged from PT and was instructed to follow-up with her referring provider. ?  ?  ?OBJECTIVE IMPAIRMENTS decreased ROM, decreased strength, postural dysfunction, and pain.  ?  ?ACTIVITY LIMITATIONS cleaning and shopping.  ?  ?PERSONAL FACTORS Age, Fitness, and Time since onset of injury/illness/exacerbation are also affecting patient's functional outcome.  ?  ?  ?GOALS: ?Goals reviewed with patient? No ?  ?SHORT TERM GOALS: ?  ?Patient will be independent and compliant with initial HEP.  ?Baseline: issued at eval  ?05/16/2021: independent ?Target date: 04/26/2021 ?Goal status: MET ?  ?          2. Patient will demonstrate knowledge and application of appropriate sitting posture to reduce stress on her neck and back.   ?                  Baseline: see posture above ?  05/16/2021:patient able to demo appropriate posture ?  Target Date: 05/03/21 ?                       Goal Status: MET ?   ?LONG TERM GOALS: ?  ?Patient will demonstrate at least 55 degrees of Lt cervical rotation AROM to improve ability to scan her environment.  ?Baseline: see above ?05/30/2021: patient demonstrates cervical rotation 70+ deg bilaterally ?Target date: 05/24/2021 ?Goal status:  MET ?  ?2.  Patient will demonstrate at least 40 degrees of cervical extension AROM to improve ability to complete overhead tasks.  ?Baseline: see above  ?05/30/2021: 45 deg ?Target date: 05/24/2021 ?Goal status: MET ?  ?3.  Patient will demonstrate 4/5 bilateral middle trap strength to maximize scapular stability.  ?Baseline: see above  ?05/30/2021: grossly 4-/5 MMT ?06/06/2021: grossly 4-/5 MMT ?Target date: 05/24/2021 ?Goal status: PARTIALLY MET ?  ?4.  Patient will be independent with advanced home program to assist in management of her chronic condition.  ?Baseline: initial HEP issued.  ?05/30/2021: progressing HEP ?06/06/2021: independent with HEP ?Target date: 05/24/2021 ?Goal status: MET ?  ?  ?PLAN: ?PT FREQUENCY: 1x/week ?  ?PT DURATION: 2 weeks ?  ?PLANNED INTERVENTIONS: Therapeutic exercises, Therapeutic activity, Neuromuscular re-education, Patient/Family education, Dry Needling, Electrical stimulation, Cryotherapy, Moist heat, Taping, and Manual therapy ?  ?PLAN FOR NEXT SESSION: NA - discharge ?  ? ?Hilda Blades, PT, DPT, LAT, ATC ?06/06/21  3:31 PM ?Phone: [phone] ?Fax: [phone] ? ? ? ?PHYSICAL THERAPY DISCHARGE SUMMARY ? ?Visits from Start of Care: 6 ? ?Current functional level related to goals / functional outcomes: ?See above ?  ?Remaining deficits: ?See above ?  ?Education / Equipment: ?HEP  ? ?Patient agrees to discharge. Patient goals were partially met. Patient is being discharged due to being pleased with the current functional level. ? ? ?

## 2021-06-06 ENCOUNTER — Other Ambulatory Visit: Payer: Self-pay

## 2021-06-06 ENCOUNTER — Encounter: Payer: Self-pay | Admitting: Physical Therapy

## 2021-06-06 ENCOUNTER — Ambulatory Visit: Payer: BC Managed Care – PPO | Attending: Plastic Surgery | Admitting: Physical Therapy

## 2021-06-06 DIAGNOSIS — M546 Pain in thoracic spine: Secondary | ICD-10-CM | POA: Diagnosis not present

## 2021-06-06 DIAGNOSIS — M6281 Muscle weakness (generalized): Secondary | ICD-10-CM | POA: Insufficient documentation

## 2021-06-06 DIAGNOSIS — R293 Abnormal posture: Secondary | ICD-10-CM | POA: Diagnosis not present

## 2021-06-06 DIAGNOSIS — M542 Cervicalgia: Secondary | ICD-10-CM | POA: Diagnosis not present

## 2021-06-06 NOTE — Patient Instructions (Signed)
Access Code: [email or web address] ?URL: [email or web address] ?Date: 06/06/2021 ?Prepared by: Rosana Hoes ? ?Exercises ?- Supine Diaphragmatic Breathing  - 1 x daily - 10 reps - 5 sec hold ?- Supine Shoulder Horizontal Abduction with Resistance  - 1 x daily - 2 sets - 10 reps ?- Supine Chin Tucks on Flat Ball  - 1 x daily - 2 sets - 10 reps - 3 seconds hold ?- Sidelying Upper Thoracic Rotation  - 1 x daily - 10 reps ?- Seated Thoracic Lumbar Extension with Pectoralis Stretch  - 1 x daily - 10 reps ?- Seated Upper Trapezius Stretch  - 1 x daily - 3 reps - 30 sec  hold ?- Seated Levator Scapulae Stretch  - 1 x daily - 3 reps - 30 seconds hold ?- Shoulder External Rotation and Scapular Retraction with Resistance  - 1 x daily - 2 sets - 10 reps ?- Standing Row with Anchored Resistance  - 1 x daily - 2 sets - 10 reps ?- Scapular Retraction with Resistance Advanced  - 1 x daily - 2 sets - 10 reps ?- Doorway Pec Stretch at 90 Degrees Abduction  - 1 x daily - 3 reps - 30 sec  hold ?

## 2021-07-05 DIAGNOSIS — N62 Hypertrophy of breast: Secondary | ICD-10-CM | POA: Diagnosis not present

## 2021-07-05 DIAGNOSIS — Z0000 Encounter for general adult medical examination without abnormal findings: Secondary | ICD-10-CM | POA: Diagnosis not present

## 2021-07-05 DIAGNOSIS — Z13220 Encounter for screening for lipoid disorders: Secondary | ICD-10-CM | POA: Diagnosis not present

## 2021-07-05 DIAGNOSIS — E039 Hypothyroidism, unspecified: Secondary | ICD-10-CM | POA: Diagnosis not present

## 2021-08-08 DIAGNOSIS — Z0289 Encounter for other administrative examinations: Secondary | ICD-10-CM

## 2021-08-15 DIAGNOSIS — H2511 Age-related nuclear cataract, right eye: Secondary | ICD-10-CM | POA: Diagnosis not present

## 2021-08-23 DIAGNOSIS — E039 Hypothyroidism, unspecified: Secondary | ICD-10-CM | POA: Diagnosis not present

## 2021-08-27 ENCOUNTER — Ambulatory Visit (INDEPENDENT_AMBULATORY_CARE_PROVIDER_SITE_OTHER): Payer: BC Managed Care – PPO | Admitting: Family Medicine

## 2021-08-27 ENCOUNTER — Encounter (INDEPENDENT_AMBULATORY_CARE_PROVIDER_SITE_OTHER): Payer: Self-pay | Admitting: Family Medicine

## 2021-08-27 VITALS — BP 141/86 | HR 88 | Temp 98.3°F | Ht 60.0 in | Wt 197.0 lb

## 2021-08-27 DIAGNOSIS — E669 Obesity, unspecified: Secondary | ICD-10-CM

## 2021-08-27 DIAGNOSIS — R4589 Other symptoms and signs involving emotional state: Secondary | ICD-10-CM

## 2021-08-27 DIAGNOSIS — E7849 Other hyperlipidemia: Secondary | ICD-10-CM

## 2021-08-27 DIAGNOSIS — M79672 Pain in left foot: Secondary | ICD-10-CM | POA: Diagnosis not present

## 2021-08-27 DIAGNOSIS — E038 Other specified hypothyroidism: Secondary | ICD-10-CM | POA: Diagnosis not present

## 2021-08-27 DIAGNOSIS — R5383 Other fatigue: Secondary | ICD-10-CM | POA: Diagnosis not present

## 2021-08-27 DIAGNOSIS — R7303 Prediabetes: Secondary | ICD-10-CM | POA: Diagnosis not present

## 2021-08-27 DIAGNOSIS — Z6838 Body mass index (BMI) 38.0-38.9, adult: Secondary | ICD-10-CM

## 2021-08-28 ENCOUNTER — Encounter (INDEPENDENT_AMBULATORY_CARE_PROVIDER_SITE_OTHER): Payer: Self-pay | Admitting: Family Medicine

## 2021-08-28 DIAGNOSIS — R7303 Prediabetes: Secondary | ICD-10-CM | POA: Insufficient documentation

## 2021-08-28 LAB — HEMOGLOBIN A1C
Est. average glucose Bld gHb Est-mCnc: 131 mg/dL
Hgb A1c MFr Bld: 6.2 % — ABNORMAL HIGH (ref 4.8–5.6)

## 2021-08-28 LAB — VITAMIN B12: Vitamin B-12: 700 pg/mL (ref 232–1245)

## 2021-08-28 LAB — VITAMIN D 25 HYDROXY (VIT D DEFICIENCY, FRACTURES): Vit D, 25-Hydroxy: 44.7 ng/mL (ref 30.0–100.0)

## 2021-09-04 NOTE — Progress Notes (Unsigned)
Chief Complaint:   OBESITY Holly Larsen (MR# [id number]) is a 61 y.o. female who presents for evaluation and treatment of obesity and related comorbidities. Current BMI is Body mass index is 38.47 kg/m. Holly Larsen has been struggling with her weight for many years and has been unsuccessful in either losing weight, maintaining weight loss, or reaching her healthy weight goal.  Holly Larsen needs to lose weight to have breast reduction surgery. Enjoys traveling. Has family stressors. High stress (with sister) has lead to to emotional eating. Was walking a lot but hurt her foot. Plans to start water exercise. Wants to lose 50 lbs. Has a treadmill at home. Craves sweets and chips. Has tried fasting in the morning some days. Making ice cream at night.   Holly Larsen is currently in the action stage of change and ready to dedicate time achieving and maintaining a healthier weight. Holly Larsen is interested in becoming our patient and working on intensive lifestyle modifications including (but not limited to) diet and exercise for weight loss.  Holly Larsen's habits were reviewed today and are as follows: Her family eats meals together, she thinks her family will eat healthier with her, her desired weight loss is 67 lbs, she started gaining weight when she fell and broke her foot, her heaviest weight ever was 195 pounds, she has significant food cravings issues, she snacks frequently in the evenings, she skips meals frequently, she is frequently drinking liquids with calories, she frequently makes poor food choices, and she struggles with emotional eating.  Depression Screen Holly Larsen's Food and Mood (modified PHQ-9) score was 15.     08/27/2021    9:43 AM  Depression screen PHQ 2/9  Decreased Interest 3  Down, Depressed, Hopeless 2  PHQ - 2 Score 5  Altered sleeping 2  Tired, decreased energy 3  Change in appetite 2  Feeling bad or failure about yourself  1  Trouble concentrating 1  Moving slowly or fidgety/restless  1  Suicidal thoughts 0  PHQ-9 Score 15  Difficult doing work/chores Somewhat difficult   Subjective:   1. Other fatigue Holly Larsen admits to daytime somnolence and admits to waking up still tired. Patient has a history of symptoms of daytime fatigue, morning fatigue, and morning headache. Jianni generally gets 6 or 8 hours of sleep per night, and states that she has generally restful sleep. Snoring is present. Apneic episodes are present. Epworth Sleepiness Score is 9.  CMP, CBC reviewed from 07/05/2021.  2. Other hyperlipidemia Holly Larsen is currently taking Crestor 10 mg daily. FLP from 07/05/21 reviewed, LDL of 176.  3. Other specified hypothyroidism Holly Larsen is currently taking Levothyroxine 75 mcg daily.  4. Pre-diabetes Holly Larsen has been consuming more sweets.  5. Left foot pain Holly Larsen has chronic pain. Limits walking but can do 2 miles at a time.  6. Depressed mood Holly Larsen mood has improved with taking space from her sister. She has support from the rest of her family and her husband. Plans to move further away from her sister.  Assessment/Plan:   1. Other fatigue Sargun does feel that her weight is causing her energy to be lower than it should be. Fatigue may be related to obesity, depression or many other causes. Labs will be ordered, and in the meanwhile, Lajean will focus on self care including making healthy food choices, increasing physical activity and focusing on stress reduction.  We will obtain labs today.  - EKG 12-Lead - Hemoglobin A1c - VITAMIN D 25 Hydroxy (Vit-D Deficiency, Fractures) -  Vitamin B12  2. Other hyperlipidemia Amea will continue healthy dietary changes, add in more consistent exercise. She will continue Crestor 10 mg daily.  3. Other specified hypothyroidism Holly Larsen's TSH was reviewed from PCP June 2023, continue Levothyroxine as directed.  4. Pre-diabetes We will check A1c today, and follow up at Holly Larsen's next office visit.  A1c 6.2 today.  5. 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   6. Depressed mood Holly Larsen will set up cognitive behavioral therapy with Dr. Dewaine Conger.  Holly Larsen is currently in the action stage of change and her goal is to continue with weight loss efforts. I recommend Holly Larsen begin the structured treatment plan as follows:  She has agreed to the Category 1 Plan.  IC not done--Not fasting. EKG: NSR today. Labs today.  Exercise goals: All adults should avoid inactivity. Some physical activity is better than none, and adults who participate in any amount of physical activity gain some health benefits.   Behavioral modification strategies: increasing lean protein intake, increasing water intake, no skipping meals, better snacking choices, and planning for success.  She was informed of the importance of frequent follow-up visits to maximize her success with intensive lifestyle modifications for her multiple health conditions. She was informed we would discuss her lab results at her next visit unless there is a critical issue that needs to be addressed sooner. Holly Larsen agreed to keep her next visit at the agreed upon time to discuss these results.  Objective:   Blood pressure (!) 141/86, pulse 88, temperature 98.3 F (36.8 C), height 5' (1.524 m), weight 197 lb (89.4 kg), SpO2 95 %. Body mass index is 38.47 kg/m.  EKG: Normal sinus rhythm, rate 83.  Indirect Calorimeter completed today shows a VO2 of (unable to obtain) and a REE of ( unable to obtain).  Her calculated basal metabolic rate is 6962 thus her basal metabolic rate is unable to determine.  General: Cooperative, alert, well developed, in no acute distress. HEENT: Conjunctivae and lids unremarkable. Cardiovascular: Regular rhythm.  Lungs: Normal work of breathing. Neurologic: No focal deficits.   No results found for: "CREATININE", "BUN", "NA", "K", "CL", "CO2" No results found for: "ALT", "AST", "GGT", "ALKPHOS", "BILITOT" Lab  Results  Component Value Date   HGBA1C 6.2 (H) 08/27/2021   No results found for: "INSULIN" No results found for: "TSH" No results found for: "CHOL", "HDL", "LDLCALC", "LDLDIRECT", "TRIG", "CHOLHDL" No results found for: "WBC", "HGB", "HCT", "MCV", "PLT" No results found for: "IRON", "TIBC", "FERRITIN"  Attestation Statements:   Reviewed by clinician on day of visit: allergies, medications, problem list, medical history, surgical history, family history, social history, and previous encounter notes.  I, Brendell Tyus, RMA, am acting as transcriptionist for Seymour Bars, DO.  I have reviewed the above documentation for accuracy and completeness, and I agree with the above. Glennis Brink, DO

## 2021-09-10 ENCOUNTER — Ambulatory Visit (INDEPENDENT_AMBULATORY_CARE_PROVIDER_SITE_OTHER): Payer: BC Managed Care – PPO | Admitting: Family Medicine

## 2021-09-10 VITALS — BP 116/82 | HR 76 | Temp 98.7°F | Ht 60.0 in | Wt 192.0 lb

## 2021-09-10 DIAGNOSIS — F3289 Other specified depressive episodes: Secondary | ICD-10-CM

## 2021-09-10 DIAGNOSIS — E669 Obesity, unspecified: Secondary | ICD-10-CM

## 2021-09-10 DIAGNOSIS — R5383 Other fatigue: Secondary | ICD-10-CM | POA: Diagnosis not present

## 2021-09-10 DIAGNOSIS — R7303 Prediabetes: Secondary | ICD-10-CM

## 2021-09-10 DIAGNOSIS — E559 Vitamin D deficiency, unspecified: Secondary | ICD-10-CM | POA: Diagnosis not present

## 2021-09-10 DIAGNOSIS — E785 Hyperlipidemia, unspecified: Secondary | ICD-10-CM

## 2021-09-10 DIAGNOSIS — R0602 Shortness of breath: Secondary | ICD-10-CM | POA: Diagnosis not present

## 2021-09-10 DIAGNOSIS — Z6837 Body mass index (BMI) 37.0-37.9, adult: Secondary | ICD-10-CM

## 2021-09-10 MED ORDER — BUPROPION HCL ER (SR) 100 MG PO TB12: 100.0000 mg | ORAL_TABLET | Freq: Two times a day (BID) | ORAL | 0 refills | Status: DC

## 2021-09-10 NOTE — Progress Notes (Signed)
we

## 2021-09-11 ENCOUNTER — Telehealth (INDEPENDENT_AMBULATORY_CARE_PROVIDER_SITE_OTHER): Payer: Self-pay

## 2021-09-11 NOTE — Telephone Encounter (Signed)
Patient called in and was seen by Dr. Cathey Endow yesterday and wanted to let her know that buPROPion ER [facility] SR) 100 MG 12 hr tablet is already working really well, she should have done this sooner. Just wanted to thank Dr. Cathey Endow.

## 2021-09-11 NOTE — Telephone Encounter (Signed)
Please review

## 2021-09-12 ENCOUNTER — Encounter (INDEPENDENT_AMBULATORY_CARE_PROVIDER_SITE_OTHER): Payer: Self-pay

## 2021-09-17 NOTE — Progress Notes (Signed)
Chief Complaint:   OBESITY Holly Larsen is here to discuss her progress with her obesity treatment plan along with follow-up of her obesity related diagnoses. Holly Larsen is on the Category 1 Plan and states she is following her eating plan approximately 100% of the time. Holly Larsen states she is doing 0 minutes 0 times per week.  Today's visit was #: 2 Starting weight: 197 lbs Starting date: 08/27/2021 Today's weight: 192 lbs Today's date: 09/10/2021 Total lbs lost to date: 5 Total lbs lost since last in-office visit: 5  Interim History: Holly Larsen is doing well with category 1 meal plan.  She has stopped baking for now, and reports decreased sugar cravings.  She has not added exercise but plans to start on the treadmill.  Had cataract surgery and dental work.  Reports better energy and sleeping better.  She has some celebrations but stopped to healthy choices.  Her husband is supportive.  Subjective:   1. Other fatigue Holly Larsen notes fatigue is improving. I discussed labs with the patient today.  2. SOB (shortness of breath) Holly Larsen's actual RMR is 1685=200 calories, better than expected.   3. Prediabetes Holly Larsen's A1c was 6.2 on her labs done on 08/27/2021. I discussed labs with the patient today.   4. Vitamin D deficiency Holly Larsen is on Vitamin D 1,000 IU daily. Her Vit D level is ok at 44 on her labs. I discussed labs with the patient today.   5. Hyperlipidemia, unspecified hyperlipidemia type Holly Larsen is on Crestor 10 mg daily per her PCP. She has a family history of hyperlipidemia. I discussed labs with the patient today.  6. Other depression, with emotional eating Holly Larsen is scheduled to see Dr. Dewaine Larsen on 10/09/2021. She is dealing with her sister and she is moving further away from her in the process.   Assessment/Plan:   1. Other fatigue Reviewed labs and IC results today. Increased daily kcals to 1200 per day.   2. SOB (shortness of breath) Reviewed labs and IC results today. Increased  daily kcals to 1200 per day.   3. Prediabetes Holly Larsen will consider the use of metformin or Rybelsus. She will continue to work on decreasing her intake od sugar, and will work on weight loss and regular exercise.   4. Vitamin D deficiency Holly Larsen agreed to increase OTC Vitamin D to 2,000 IU daily. We will recheck labs in 4 months.   5. Hyperlipidemia, unspecified hyperlipidemia type Holly Larsen will be seeing her PCP soon for recheck of labs.   6. Other depression, with emotional eating Holly Larsen agreed to start Holly Larsen 100 mg BID with no refills. She will keep her cognitive behavioral therapy visit.   - buPROPion ER (Holly Larsen) 100 MG 12 hr tablet; Take 1 tablet (100 mg total) by mouth 2 (two) times daily.  Dispense: 60 tablet; Refill: 0  7. Obesity, current BMI 37.5 Holly Larsen is currently in the action stage of change. As such, her goal is to continue with weight loss efforts. She has agreed to the Category 1 Plan, the Category 2 Plan, and keeping a food journal and adhering to recommended goals of 1200 calories and 85-90 grams of protein daily.   Information given on the use of my fitness pal app.  Recommended sugar-free drink mixed versus regular.  Exercise goals: Plan to increase walking time.  Behavioral modification strategies: increasing lean protein intake, increasing water intake, decreasing liquid calories, no skipping meals, keeping healthy foods in the home, emotional eating strategies, and decreasing junk food.  Holly Larsen has agreed to follow-up with our clinic in 2 weeks. She was informed of the importance of frequent follow-up visits to maximize her success with intensive lifestyle modifications for her multiple health conditions.   Objective:   Blood pressure 116/82, pulse 76, temperature 98.7 F (37.1 C), height 5' (1.524 m), weight 192 lb (87.1 kg), SpO2 96 %. Body mass index is 37.5 kg/m.  General: Cooperative, alert, well developed, in no acute distress. HEENT:  Conjunctivae and lids unremarkable. Cardiovascular: Regular rhythm.  Lungs: Normal work of breathing. Neurologic: No focal deficits.   No results found for: "CREATININE", "BUN", "NA", "K", "CL", "CO2" No results found for: "ALT", "AST", "GGT", "ALKPHOS", "BILITOT" Lab Results  Component Value Date   HGBA1C 6.2 (H) 08/27/2021   No results found for: "INSULIN" No results found for: "TSH" No results found for: "CHOL", "HDL", "LDLCALC", "LDLDIRECT", "TRIG", "CHOLHDL" Lab Results  Component Value Date   VD25OH 44.7 08/27/2021   No results found for: "WBC", "HGB", "HCT", "MCV", "PLT" No results found for: "IRON", "TIBC", "FERRITIN"  Attestation Statements:   Reviewed by clinician on day of visit: allergies, medications, problem list, medical history, surgical history, family history, social history, and previous encounter notes.   Holly Larsen, am acting as transcriptionist for Holly Bars, DO.  I have reviewed the above documentation for accuracy and completeness, and I agree with the above. Holly Brink, DO

## 2021-09-24 ENCOUNTER — Ambulatory Visit (INDEPENDENT_AMBULATORY_CARE_PROVIDER_SITE_OTHER): Payer: BC Managed Care – PPO | Admitting: Family Medicine

## 2021-09-25 ENCOUNTER — Encounter (INDEPENDENT_AMBULATORY_CARE_PROVIDER_SITE_OTHER): Payer: Self-pay | Admitting: Family Medicine

## 2021-09-25 ENCOUNTER — Ambulatory Visit (INDEPENDENT_AMBULATORY_CARE_PROVIDER_SITE_OTHER): Payer: BC Managed Care – PPO | Admitting: Family Medicine

## 2021-09-25 VITALS — BP 124/81 | HR 77 | Temp 97.9°F | Ht 60.0 in | Wt 190.0 lb

## 2021-09-25 DIAGNOSIS — Z6837 Body mass index (BMI) 37.0-37.9, adult: Secondary | ICD-10-CM

## 2021-09-25 DIAGNOSIS — E669 Obesity, unspecified: Secondary | ICD-10-CM | POA: Diagnosis not present

## 2021-09-25 DIAGNOSIS — F32A Depression, unspecified: Secondary | ICD-10-CM | POA: Insufficient documentation

## 2021-09-25 DIAGNOSIS — E559 Vitamin D deficiency, unspecified: Secondary | ICD-10-CM | POA: Diagnosis not present

## 2021-09-25 DIAGNOSIS — F3289 Other specified depressive episodes: Secondary | ICD-10-CM | POA: Diagnosis not present

## 2021-09-25 MED ORDER — BUPROPION HCL ER (SR) 100 MG PO TB12: 100.0000 mg | ORAL_TABLET | Freq: Two times a day (BID) | ORAL | 0 refills | Status: DC
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
## 2021-09-30 NOTE — Progress Notes (Unsigned)
Chief Complaint:   OBESITY Holly Larsen is here to discuss her progress with her obesity treatment plan along with follow-up of her obesity related diagnoses. Holly Larsen is on Category 1 Plan, the Category 2 Plan, and keeping a food journal and adhering to recommended goals of 1200 calories and 85-90 grams of protein daily.and states she is following her eating plan approximately 99% of the time. Holly Larsen states she is walking treadmill 30 minutes 2 times per week.  Today's visit was #: 3 Starting weight: 197 lbs Starting date: 07/214/2023 Today's weight: 190 lbs Today's date: 09/25/2021 Total lbs lost to date: 7 lbs Total lbs lost since last in-office visit: 2 lbs  Interim History: had a few meals off plan but is leaving more about balance.  Fitting in smaller sizes.  Feels happier with improved energy level.  Has started walking dogs up to 2 hours 1-2 times per week.  Moving away from her sister next month that causes drama.    Subjective:   1. Vitamin D deficiency Started Vtiamin D 2,000 IU daily.  08/27/21, last Vitamin D level 44.7.   2. Other depression, with emotional eating Improving on Wellbutrin SR 100 mg BID.  Sleeping better, has seen a reduction in stress eating.   Assessment/Plan:   1. Vitamin D deficiency Continue OTC Vitamin D 2,000 IU daily.   2. Other depression, with emotional eating Upcoming CBT visit scheduled for 10/09/2021.  Continue and refill - buPROPion ER (WELLBUTRIN SR) 100 MG 12 hr tablet; Take 1 tablet (100 mg total) by mouth 2 (two) times daily.  Dispense: 60 tablet; Refill: 0  3. Obesity, current BMI 37.3 Has option to change to journaling on the lose it app.   Holly Larsen is currently in the action stage of change. As such, her goal is to continue with weight loss efforts. She has agreed to keeping a food journal and adhering to recommended goals of 1200 calories and 85 protein daily.   Exercise goals:  walking 30 minutes at least 3 times per week.    Behavioral modification strategies: increasing lean protein intake, increasing water intake, decreasing eating out, no skipping meals, meal planning and cooking strategies, better snacking choices, avoiding temptations, and decreasing junk food.  Holly Larsen has agreed to follow-up with our clinic in 3 weeks. She was informed of the importance of frequent follow-up visits to maximize her success with intensive lifestyle modifications for her multiple health conditions.   Objective:   Blood pressure 124/81, pulse 77, temperature 97.9 F (36.6 C), height 5' (1.524 m), weight 190 lb (86.2 kg), SpO2 95 %. Body mass index is 37.11 kg/m.  General: Cooperative, alert, well developed, in no acute distress. HEENT: Conjunctivae and lids unremarkable. Cardiovascular: Regular rhythm.  Lungs: Normal work of breathing. Neurologic: No focal deficits.   No results found for: "CREATININE", "BUN", "NA", "K", "CL", "CO2" No results found for: "ALT", "AST", "GGT", "ALKPHOS", "BILITOT" Lab Results  Component Value Date   HGBA1C 6.2 (H) 08/27/2021   No results found for: "INSULIN" No results found for: "TSH" No results found for: "CHOL", "HDL", "LDLCALC", "LDLDIRECT", "TRIG", "CHOLHDL" Lab Results  Component Value Date   VD25OH 44.7 08/27/2021   No results found for: "WBC", "HGB", "HCT", "MCV", "PLT" No results found for: "IRON", "TIBC", "FERRITIN"  Attestation Statements:   Reviewed by clinician on day of visit: allergies, medications, problem list, medical history, surgical history, family history, social history, and previous encounter notes.  Marily Lente, am acting as Energy manager  for Seymour Bars, DO.  I have reviewed the above documentation for accuracy and completeness, and I agree with the above. -  ***

## 2021-10-09 ENCOUNTER — Telehealth (INDEPENDENT_AMBULATORY_CARE_PROVIDER_SITE_OTHER): Payer: BC Managed Care – PPO | Admitting: Psychology

## 2021-10-09 ENCOUNTER — Ambulatory Visit (INDEPENDENT_AMBULATORY_CARE_PROVIDER_SITE_OTHER): Payer: BC Managed Care – PPO | Admitting: Psychology

## 2021-10-09 DIAGNOSIS — F5089 Other specified eating disorder: Secondary | ICD-10-CM

## 2021-10-09 DIAGNOSIS — F419 Anxiety disorder, unspecified: Secondary | ICD-10-CM | POA: Diagnosis not present

## 2021-10-09 DIAGNOSIS — F32A Depression, unspecified: Secondary | ICD-10-CM

## 2021-10-22 ENCOUNTER — Ambulatory Visit (INDEPENDENT_AMBULATORY_CARE_PROVIDER_SITE_OTHER): Payer: BC Managed Care – PPO | Admitting: Family Medicine

## 2021-10-23 ENCOUNTER — Telehealth (INDEPENDENT_AMBULATORY_CARE_PROVIDER_SITE_OTHER): Payer: BC Managed Care – PPO | Admitting: Psychology

## 2021-10-23 ENCOUNTER — Telehealth (INDEPENDENT_AMBULATORY_CARE_PROVIDER_SITE_OTHER): Payer: Self-pay | Admitting: Psychology

## 2021-10-23 ENCOUNTER — Encounter (INDEPENDENT_AMBULATORY_CARE_PROVIDER_SITE_OTHER): Payer: Self-pay

## 2021-10-23 NOTE — Progress Notes (Unsigned)
  Office: [phone]  /  Fax: [phone]    Date: October 23, 2021    Appointment Start Time: *** Duration: *** minutes Provider: Glennie Isle, [name] Type of Session: Individual Therapy  Location of Patient: {gbptloc:23249} (private location) Location of Provider: Provider's Home (private office) Type of Contact: Telepsychological Visit via MyChart Video Visit  Session Content: This provider called Holly Larsen at 10:02am as she did not present for today's appointment. A HIPAA compliant voicemail was left requesting a call back. As such, today's appointment was initiated *** minutes late.Holly Larsen is a 61 y.o. female presenting for a follow-up appointment to address the previously established treatment goal of increasing coping skills.Today's appointment was a telepsychological visit. Holly Larsen provided verbal consent for today's telepsychological appointment and she is aware she is responsible for securing confidentiality on her end of the session. Prior to proceeding with today's appointment, Holly Larsen's physical location at the time of this appointment was obtained as well a phone number she could be reached at in the event of technical difficulties. Holly Larsen and this provider participated in today's telepsychological service.   This provider conducted a brief check-in. *** Holly Larsen was receptive to today's appointment as evidenced by openness to sharing, responsiveness to feedback, and {gbreceptiveness:23401}.  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 Holly Larsen has demonstrated progress in her goal as evidenced by {gbtxprogress:22839}. Holly Larsen also {gbtxprogress2:22951}.  Plan: The next appointment is scheduled for *** at ***, which will be via MyChart Video Visit. The next session will focus on {Plan for Next Appointment:23400}.

## 2021-10-23 NOTE — Telephone Encounter (Signed)
  Office: [phone]  /  Fax: [phone]  Date of Call: October 23, 2021  Time of Call: 10:02am Provider: Glennie Isle, PsyD  CONTENT: This provider called [name] to check-in as she did not present for today's MyChart Video Visit appointment at 10am. A HIPAA compliant voicemail was left requesting a call back. Of note, this provider stayed on the MyChart Video Visit appointment for 5 minutes prior to signing off per the clinic's grace period policy.    PLAN: This provider will wait for Mashal to call back. No further follow-up planned by this provider.

## 2021-10-29 ENCOUNTER — Ambulatory Visit (INDEPENDENT_AMBULATORY_CARE_PROVIDER_SITE_OTHER): Payer: BC Managed Care – PPO | Admitting: Family Medicine

## 2021-10-29 ENCOUNTER — Encounter (INDEPENDENT_AMBULATORY_CARE_PROVIDER_SITE_OTHER): Payer: Self-pay | Admitting: Family Medicine

## 2021-10-29 VITALS — BP 137/83 | HR 86 | Temp 98.3°F | Ht 60.0 in | Wt 187.0 lb

## 2021-10-29 DIAGNOSIS — R7303 Prediabetes: Secondary | ICD-10-CM

## 2021-10-29 DIAGNOSIS — E669 Obesity, unspecified: Secondary | ICD-10-CM | POA: Diagnosis not present

## 2021-10-29 DIAGNOSIS — Z6836 Body mass index (BMI) 36.0-36.9, adult: Secondary | ICD-10-CM

## 2021-10-29 DIAGNOSIS — F3289 Other specified depressive episodes: Secondary | ICD-10-CM

## 2021-10-29 MED ORDER — BUPROPION HCL ER (SR) 100 MG PO TB12: 100.0000 mg | ORAL_TABLET | Freq: Two times a day (BID) | ORAL | 0 refills | Status: DC

## 2021-10-31 NOTE — Progress Notes (Signed)
     Chief Complaint:   OBESITY Holly Larsen is here to discuss her progress with her obesity treatment plan along with follow-up of her obesity related diagnoses. Holly Larsen is on keeping a food journal and adhering to recommended goals of 1200 calories and 85 protein and states she is following her eating plan approximately 85% of the time. Holly Larsen states she is walking 30 minutes 3 times per week.  Today's visit was #: 4 Starting weight: 197 lbs Starting date: 08/27/2021 Today's weight: 187 lbs Today's date: 10/29/2021 Total lbs lost to date: 10 lbs Total lbs lost since last in-office visit: 3 lbs  Interim History: She has moved into a new house.  Changed from meal plan to logging.  Happy ot have moved away from her sister.  Husband is supportive.  She has plans to see her son in [place] next month.  Sleeping better at night.  Has a treadmill and plans to use.   Subjective:   1. Pre-diabetes A1c, 6.2 on 08/27/2021.  2. Other depression, with emotional eating She is taking Wellbutrin SR 100 mg BID.  She started CBT with Dr Dewaine Conger.    Assessment/Plan:   1. Pre-diabetes Recheck A1c in November.  Actively working on decreasing sugar intake.   2. Other depression, with emotional eating Referred to an outside therapist.   Refill - buPROPion ER (WELLBUTRIN SR) 100 MG 12 hr tablet; Take 1 tablet (100 mg total) by mouth 2 (two) times daily.  Dispense: 60 tablet; Refill: 0  3. Obesity,current BMI 36.6 Holly Larsen is currently in the action stage of change. As such, her goal is to continue with weight loss efforts. She has agreed to keeping a food journal and adhering to recommended goals of 1200 calories and 85 protein daily.   Exercise goals:  Walking and weights 5 times per week.   Behavioral modification strategies: increasing lean protein intake, increasing vegetables, increasing water intake, decreasing liquid calories, decreasing eating out, no skipping meals, meal planning and cooking  strategies, emotional eating strategies, and decreasing junk food.  Holly Larsen has agreed to follow-up with our clinic in 3 weeks. She was informed of the importance of frequent follow-up visits to maximize her success with intensive lifestyle modifications for her multiple health conditions.   Objective:   Blood pressure 137/83, pulse 86, temperature 98.3 F (36.8 C), height 5' (1.524 m), weight 187 lb (84.8 kg), SpO2 96 %. Body mass index is 36.52 kg/m.  General: Cooperative, alert, well developed, in no acute distress. HEENT: Conjunctivae and lids unremarkable. Cardiovascular: Regular rhythm.  Lungs: Normal work of breathing. Neurologic: No focal deficits.   No results found for: "CREATININE", "BUN", "NA", "K", "CL", "CO2" No results found for: "ALT", "AST", "GGT", "ALKPHOS", "BILITOT" Lab Results  Component Value Date   HGBA1C 6.2 (H) 08/27/2021   No results found for: "INSULIN" No results found for: "TSH" No results found for: "CHOL", "HDL", "LDLCALC", "LDLDIRECT", "TRIG", "CHOLHDL" Lab Results  Component Value Date   VD25OH 44.7 08/27/2021   No results found for: "WBC", "HGB", "HCT", "MCV", "PLT" No results found for: "IRON", "TIBC", "FERRITIN"  Attestation Statements:   Reviewed by clinician on day of visit: allergies, medications, problem list, medical history, surgical history, family history, social history, and previous encounter notes.  I, Malcolm Metro, am acting as Energy manager for Seymour Bars, DO.  I have reviewed the above documentation for accuracy and completeness, and I agree with the above. Glennis Brink, DO

## 2021-11-01 ENCOUNTER — Telehealth: Payer: Self-pay

## 2021-11-01 NOTE — Telephone Encounter (Signed)
Called patient, LMVM to call our office back and schedule a consult with Dr. Marla Roe, or Dr. Lovena Le for BL breast reduction. Its been since 03/15/21 she was seen with Dr. Claudia Desanctis. Patient has completed her PT

## 2021-11-06 ENCOUNTER — Telehealth (INDEPENDENT_AMBULATORY_CARE_PROVIDER_SITE_OTHER): Payer: BC Managed Care – PPO | Admitting: Psychology

## 2021-11-12 ENCOUNTER — Telehealth (INDEPENDENT_AMBULATORY_CARE_PROVIDER_SITE_OTHER): Payer: BC Managed Care – PPO | Admitting: Psychology

## 2021-11-12 DIAGNOSIS — F419 Anxiety disorder, unspecified: Secondary | ICD-10-CM

## 2021-11-12 DIAGNOSIS — F32A Depression, unspecified: Secondary | ICD-10-CM | POA: Diagnosis not present

## 2021-11-12 DIAGNOSIS — F5089 Other specified eating disorder: Secondary | ICD-10-CM

## 2021-11-12 NOTE — Progress Notes (Signed)
  Office: [phone]  /  Fax: [phone]    Date: November 12, 2021    Appointment Start Time: 2:31pm Duration: 27 minutes Provider: Glennie Isle, [name] Type of Session: Individual Therapy  Location of Patient: Home (private location) Location of Provider: Provider's Home (private office) Type of Contact: Telepsychological Visit via MyChart Video Visit  Session Content: Yen is a 61 y.o. female presenting for a follow-up appointment to address the previously established treatment goal of increasing coping skills.Today's appointment was a telepsychological visit. Ilham provided verbal consent for today's telepsychological appointment and she is aware she is responsible for securing confidentiality on her end of the session. Prior to proceeding with today's appointment, Kimorah's physical location at the time of this appointment was obtained as well a phone number she could be reached at in the event of technical difficulties. Mazie and this provider participated in today's telepsychological service.   This provider conducted a brief check-in. Ashlynn shared about recent events, including an upcoming trip to [place]. Regarding eating habits, she noted some challenges while moving. Nevertheless, she described making better choices and engaging in portion control. Reviewed emotional and physical hunger. Psychoeducation regarding making better choices and engaging in portion control during holidays/celebrations/traveling was provided due to her upcoming trip. More specifically, this provider discussed the following strategies: coming to meals hungry, but not starving; avoid filling up on appetizers; managing portion sizes; not completely depriving yourself; making the plate colorful (e.g., vegetables); pacing yourself (e.g., waiting 10 minutes before going back for seconds); taking advantage of the nutritious foods; practicing mindfulness; staying hydrated; and avoid bringing home leftovers.  Overall, Raechell was receptive to today's appointment as evidenced by openness to sharing, responsiveness to feedback, and willingness to implement discussed strategies .  Mental Status Examination:  Appearance: neat Behavior: appropriate to circumstances Mood: neutral Affect: mood congruent Speech: WNL Eye Contact: appropriate Psychomotor Activity: WNL Gait: unable to assess Thought Process: linear, logical, and goal directed and no evidence or endorsement of suicidal, homicidal, and self-harm ideation, plan and intent  Thought Content/Perception: no hallucinations, delusions, bizarre thinking or behavior endorsed or observed Orientation: AAOx4 Memory/Concentration: memory, attention, language, and fund of knowledge intact  Insight: fair Judgment: fair  Interventions:  Conducted a brief chart review Provided empathic reflections and validation Reviewed content from the previous session Provided positive reinforcement Employed supportive psychotherapy interventions to facilitate reduced distress and to improve coping skills with identified stressors Psychoeducation provided regarding strategies for celebrations/holidays/vacations  DSM-5 Diagnosis(es):  F50.89 Other Specified Feeding or Eating Disorder, Emotional Eating Behaviors, F41.9 Unspecified Anxiety Disorder, and  F32.A Unspecified Depressive Disorder  Treatment Goal & Progress: During the initial appointment with this provider, the following treatment goal was established: increase coping skills. Progress is limited, as Lashanna has just begun treatment with this provider; however, she is receptive to the interaction and interventions and rapport is being established.   Plan: Due to Katey's upcoming trip, the next appointment is scheduled for 12/04/2021 at 2:30pm, which will be via MyChart Video Visit. The next session will focus on working towards the established treatment goal. Imogen will be initiating therapeutic services with  her primary therapist on December 19, 2021.

## 2021-11-20 ENCOUNTER — Ambulatory Visit: Payer: BC Managed Care – PPO | Admitting: Behavioral Health

## 2021-12-04 ENCOUNTER — Telehealth (INDEPENDENT_AMBULATORY_CARE_PROVIDER_SITE_OTHER): Payer: BC Managed Care – PPO | Admitting: Psychology

## 2021-12-04 DIAGNOSIS — F32A Depression, unspecified: Secondary | ICD-10-CM | POA: Diagnosis not present

## 2021-12-04 DIAGNOSIS — F5089 Other specified eating disorder: Secondary | ICD-10-CM

## 2021-12-04 DIAGNOSIS — F419 Anxiety disorder, unspecified: Secondary | ICD-10-CM | POA: Diagnosis not present

## 2021-12-04 NOTE — Progress Notes (Signed)
  Office: [phone]  /  Fax: [phone]    Date: December 04, 2021    Appointment Start Time: 2:30pm Duration: 26 minutes Provider: Glennie Isle, [name] Type of Session: Individual Therapy  Location of Patient: Home (private location) Location of Provider: Provider's Home (private office) Type of Contact: Telepsychological Visit via MyChart Video Visit  Session Content: Holly Larsen is a 61 y.o. female presenting for a follow-up appointment to address the previously established treatment goal of increasing coping skills.Today's appointment was a telepsychological visit. Holly Larsen provided verbal consent for today's telepsychological appointment and she is aware she is responsible for securing confidentiality on her end of the session. Prior to proceeding with today's appointment, Holly Larsen's physical location at the time of this appointment was obtained as well a phone number she could be reached at in the event of technical difficulties. Holly Larsen and this provider participated in today's telepsychological service.   This provider conducted a brief check-in. Holly Larsen shared about her recent vacation, noting, "It was a lot of fun." Discussed what went well (e.g., focusing on making better choices and engaging in portion control) and what she could do differently (e.g., drink more water and reduce alcohol intake, reduce dessert intake, acquire foods from the structured meal plan) in the future as it relates to eating habits when traveling. Of note, she noted she fell on two occassions, noting a plan to follow up with her PCP today. Remainder of today's appointment focused on triggers for emotional eating behaviors. Psychoeducation was provided. Holly Larsen was provided a handout, and encouraged to utilize the handout between now and the next appointment to increase awareness of triggers and frequency. Holly Larsen agreed. This provider also discussed behavioral strategies for specific triggers, such as placing the utensil  down when conversing to avoid mindless eating. Holly Larsen provided verbal consent during today's appointment for this provider to send a handout about triggers via e-mail. Holly Larsen was receptive to today's appointment as evidenced by openness to sharing, responsiveness to feedback, and willingness to explore triggers for emotional eating.  Mental Status Examination:  Appearance: neat Behavior: appropriate to circumstances Mood: neutral Affect: mood congruent Speech: WNL Eye Contact: appropriate Psychomotor Activity: WNL Gait: unable to assess Thought Process: linear, logical, and goal directed and no evidence or endorsement of suicidal, homicidal, and self-harm ideation, plan and intent  Thought Content/Perception: no hallucinations, delusions, bizarre thinking or behavior endorsed or observed Orientation: AAOx4 Memory/Concentration: memory, attention, language, and fund of knowledge intact  Insight: fair Judgment: fair  Interventions:  Conducted a brief chart review Provided empathic reflections and validation Employed supportive psychotherapy interventions to facilitate reduced distress and to improve coping skills with identified stressors Psychoeducation provided regarding triggers for emotional eating behaviors  DSM-5 Diagnosis(es):  F50.89 Other Specified Feeding or Eating Disorder, Emotional Eating Behaviors, F41.9 Unspecified Anxiety Disorder, and  F32.A Unspecified Depressive Disorder  Treatment Goal & Progress: During the initial appointment with this provider, the following treatment goal was established: increase coping skills. Holly Larsen has demonstrated progress in her goal as evidenced by increased awareness of hunger patterns.   Plan: The next appointment is scheduled for 12/25/2021 at 4pm, which will be via MyChart Video Visit. The next session will focus on working towards the established treatment goal. Holly Larsen rescheduled her appointment with her primary therapist due to recent  travel. They are scheduled to meet on 12/19/2021.

## 2021-12-07 ENCOUNTER — Ambulatory Visit: Payer: BC Managed Care – PPO | Admitting: Behavioral Health

## 2021-12-07 DIAGNOSIS — M549 Dorsalgia, unspecified: Secondary | ICD-10-CM | POA: Diagnosis not present

## 2021-12-07 DIAGNOSIS — R0789 Other chest pain: Secondary | ICD-10-CM | POA: Diagnosis not present

## 2021-12-07 DIAGNOSIS — W19XXXA Unspecified fall, initial encounter: Secondary | ICD-10-CM | POA: Diagnosis not present

## 2021-12-10 ENCOUNTER — Ambulatory Visit (INDEPENDENT_AMBULATORY_CARE_PROVIDER_SITE_OTHER): Payer: BC Managed Care – PPO | Admitting: Family Medicine

## 2021-12-10 ENCOUNTER — Encounter (INDEPENDENT_AMBULATORY_CARE_PROVIDER_SITE_OTHER): Payer: Self-pay | Admitting: Family Medicine

## 2021-12-10 VITALS — BP 161/83 | HR 84 | Temp 98.2°F | Ht 60.0 in | Wt 186.0 lb

## 2021-12-10 DIAGNOSIS — R030 Elevated blood-pressure reading, without diagnosis of hypertension: Secondary | ICD-10-CM

## 2021-12-10 DIAGNOSIS — F3289 Other specified depressive episodes: Secondary | ICD-10-CM | POA: Diagnosis not present

## 2021-12-10 DIAGNOSIS — R7303 Prediabetes: Secondary | ICD-10-CM | POA: Diagnosis not present

## 2021-12-10 DIAGNOSIS — Z6836 Body mass index (BMI) 36.0-36.9, adult: Secondary | ICD-10-CM

## 2021-12-10 DIAGNOSIS — E669 Obesity, unspecified: Secondary | ICD-10-CM

## 2021-12-10 MED ORDER — BUPROPION HCL ER (SR) 100 MG PO TB12: 100.0000 mg | ORAL_TABLET | Freq: Two times a day (BID) | ORAL | 0 refills | Status: DC

## 2021-12-11 DIAGNOSIS — R030 Elevated blood-pressure reading, without diagnosis of hypertension: Secondary | ICD-10-CM | POA: Insufficient documentation

## 2021-12-11 LAB — HEMOGLOBIN A1C
Est. average glucose Bld gHb Est-mCnc: 128 mg/dL
Hgb A1c MFr Bld: 6.1 % — ABNORMAL HIGH (ref 4.8–5.6)

## 2021-12-19 ENCOUNTER — Ambulatory Visit (INDEPENDENT_AMBULATORY_CARE_PROVIDER_SITE_OTHER): Payer: BC Managed Care – PPO | Admitting: Behavioral Health

## 2021-12-19 DIAGNOSIS — F4323 Adjustment disorder with mixed anxiety and depressed mood: Secondary | ICD-10-CM | POA: Diagnosis not present
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
## 2021-12-19 NOTE — Progress Notes (Signed)
                Tannon Peerson L Aysia Lowder, [name] 

## 2021-12-20 NOTE — Progress Notes (Signed)
Chief Complaint:   OBESITY Holly Larsen is here to discuss her progress with her obesity treatment plan along with follow-up of her obesity related diagnoses. Holly Larsen is on keeping a food journal and adhering to recommended goals of 1200 calories and 85 protein and states she is following her eating plan approximately 40% of the time. Holly Larsen states she is walking 60 minutes 7 times per week.  Today's visit was #: 5 Starting weight: 197 lbs Starting date: 08/27/2021 Today's weight: 186 lbs Today's date: 12/10/2021 Total lbs lost to date: 11 lbs Total lbs lost since last in-office visit: 1 lb  Interim History: She went to [place] to visit her children for 2 weeks.  Ate some bread, potatoes, a bagel, cheesecake, birthday cake while gone. Back on track at home.  Walked more despite 2 falls.  She plans to restart her walking.   Subjective:   1. Pre-diabetes She has reduced her sugar intake, but did indulge on her trip.  Last A1c 6.2  2. Elevated BP without diagnosis of hypertension No previous history of hypertension.  Has pain today from Holly Larsen contusion  when falling in [place].   3. Other depression, with emotional eating She is on Wellbutrin SR 100 mg BID, which is helping.  She is seeing Dr Mallie Mussel for CBT.  She denies adverse side effects.   Assessment/Plan:   1. Pre-diabetes Check labs today.   - Hemoglobin A1c  2. Elevated BP without diagnosis of hypertension Will closely monitor.   3. Other depression, with emotional eating Refill - buPROPion ER (WELLBUTRIN SR) 100 MG 12 hr tablet; Take 1 tablet (100 mg total) by mouth 2 (two) times daily.  Dispense: 60 tablet; Refill: 0  4. Obesity,current BMI 36.5 Reviewed Bioimpedance:  she gained 2 lbs of muscle and lost 3 lb of body fat.   Holly Larsen is currently in the action stage of change. As such, her goal is to continue with weight loss efforts. She has agreed to the Category 2 Plan+85 protein daily.    Exercise goals:   Walking 30 minutes 3-5 days per week.   Behavioral modification strategies: increasing lean protein intake, increasing vegetables, increasing water intake, decreasing eating out, no skipping meals, meal planning and cooking strategies, keeping healthy foods in the home, celebration eating strategies, and planning for success.  Holly Larsen has agreed to follow-up with our clinic in 4 weeks. She was informed of the importance of frequent follow-up visits to maximize her success with intensive lifestyle modifications for her multiple health conditions.   Holly Larsen was informed we would discuss her lab results at her next visit unless there is a critical issue that needs to be addressed sooner. Holly Larsen agreed to keep her next visit at the agreed upon time to discuss these results.  Objective:   Blood pressure (!) 161/83, pulse 84, temperature 98.2 F (36.8 C), height 5' (1.524 m), weight 186 lb (84.4 kg), SpO2 100 %. Body mass index is 36.33 kg/m.  General: Cooperative, alert, well developed, in no acute distress. HEENT: Conjunctivae and lids unremarkable. Cardiovascular: Regular rhythm.  Lungs: Normal work of breathing. Neurologic: No focal deficits.   No results found for: "CREATININE", "BUN", "NA", "K", "CL", "CO2" No results found for: "ALT", "AST", "GGT", "ALKPHOS", "BILITOT" Lab Results  Component Value Date   HGBA1C 6.1 (H) 12/10/2021   HGBA1C 6.2 (H) 08/27/2021   No results found for: "INSULIN" No results found for: "TSH" No results found for: "CHOL", "HDL", "LDLCALC", "LDLDIRECT", "TRIG", "CHOLHDL" Lab  Results  Component Value Date   VD25OH 44.7 08/27/2021   No results found for: "WBC", "HGB", "HCT", "MCV", "PLT" No results found for: "IRON", "TIBC", "FERRITIN"  Attestation Statements:   Reviewed by clinician on day of visit: allergies, medications, problem list, medical history, surgical history, family history, social history, and previous encounter notes.  I have personally  spent 40 minutes total time today in preparation, patient care, and documentation for this visit, including the following: review of clinical lab tests; review of medical tests/procedures/services.    I, Malcolm Metro, am acting as Energy manager for Seymour Bars, DO.  I have reviewed the above documentation for accuracy and completeness, and I agree with the above. Glennis Brink, DO

## 2021-12-25 ENCOUNTER — Telehealth (INDEPENDENT_AMBULATORY_CARE_PROVIDER_SITE_OTHER): Payer: Self-pay | Admitting: Psychology

## 2021-12-25 ENCOUNTER — Telehealth (INDEPENDENT_AMBULATORY_CARE_PROVIDER_SITE_OTHER): Payer: BC Managed Care – PPO | Admitting: Psychology

## 2021-12-25 NOTE — Telephone Encounter (Addendum)
  Office: [phone]  /  Fax: [phone]  Date of Call: December 25, 2021  Time of Call: 4:03pm Duration of Call: ~5 minute(s) Provider: Lawerance Cruel, PsyD  CONTENT: This provider called Holly Larsen to check-in as she did not present for today's MyChart Video Visit appointment at 4pm. She stated she thought the appointment was at 4:30pm and was receptive to rescheduling. Holly Larsen stated she is doing well, but shared she recently suffered a fall, adding, "I'm okay now." No evidence or endorsement of any safety concerns. All questions/concerns addressed.   PLAN: Holly Larsen is scheduled for an appointment on 01/01/2022 at 11am via MyChart Video Visit.

## 2021-12-25 NOTE — Progress Notes (Incomplete)
  Office: [phone]  /  Fax: [phone]    Date: December 25, 2021    Appointment Start Time: *** Duration: *** minutes Provider: Lawerance Cruel, [name] Type of Session: Individual Therapy  Location of Patient: {gbptloc:23249} (private location) Location of Provider: Provider's Home (private office) Type of Contact: Telepsychological Visit via MyChart Video Visit  Session Content: This provider called Alan Ripper at 4:03pm as she did not present for today's appointment. *** As such, today's appointment was initiated *** minutes late.Railee is a 61 y.o. female presenting for a follow-up appointment to address the previously established treatment goal of increasing coping skills.Today's appointment was a telepsychological visit. Alyssamarie provided verbal consent for today's telepsychological appointment and she is aware she is responsible for securing confidentiality on her end of the session. Prior to proceeding with today's appointment, Idy's physical location at the time of this appointment was obtained as well a phone number she could be reached at in the event of technical difficulties. Shanitra and this provider participated in today's telepsychological service.   This provider conducted a brief check-in. *** Karyss was receptive to today's appointment as evidenced by openness to sharing, responsiveness to feedback, and {gbreceptiveness:23401}.  Mental Status Examination:  Appearance: {Appearance:22431} Behavior: {Behavior:22445} Mood: {gbmood:21757} Affect: {Affect:22436} Speech: {Speech:22432} Eye Contact: {Eye Contact:22433} Psychomotor Activity: {Motor Activity:22434} Gait: {gbgait:23404} Thought Process: {thought process:22448}  Thought Content/Perception: {disturbances:22451} Orientation: {Orientation:22437} Memory/Concentration: {gbcognition:22449} Insight: {Insight:22446} Judgment: {Insight:22446}  Interventions:  {Interventions for Progress Notes:23405}  DSM-5  Diagnosis(es): {Diagnoses:22752}  Treatment Goal & Progress: During the initial appointment with this provider, the following treatment goal was established: increase coping skills. Oline has demonstrated progress in her goal as evidenced by {gbtxprogress:22839}. Dehlia also {gbtxprogress2:22951}.  Plan: The next appointment is scheduled for *** at ***, which will be via MyChart Video Visit. The next session will focus on {Plan for Next Appointment:23400}.

## 2022-01-01 ENCOUNTER — Telehealth (INDEPENDENT_AMBULATORY_CARE_PROVIDER_SITE_OTHER): Payer: BC Managed Care – PPO | Admitting: Psychology

## 2022-01-01 DIAGNOSIS — F32A Depression, unspecified: Secondary | ICD-10-CM | POA: Diagnosis not present

## 2022-01-01 DIAGNOSIS — F5089 Other specified eating disorder: Secondary | ICD-10-CM | POA: Diagnosis not present

## 2022-01-01 DIAGNOSIS — F419 Anxiety disorder, unspecified: Secondary | ICD-10-CM

## 2022-01-01 NOTE — Progress Notes (Signed)
  Office: [phone]  /  Fax: [phone]    Date: January 01, 2022    Appointment Start Time: 11:01am Duration: 23 minutes Provider: Lawerance Cruel, [name] Type of Session: Individual Therapy  Location of Patient: Home (private location) Location of Provider: Provider's Home (private office) Type of Contact: Telepsychological Visit via MyChart Video Visit  Session Content: Abaigeal is a 61 y.o. female presenting for a follow-up appointment to address the previously established treatment goal of increasing coping skills.Today's appointment was a telepsychological visit. Abrial provided verbal consent for today's telepsychological appointment and she is aware she is responsible for securing confidentiality on her end of the session. Prior to proceeding with today's appointment, Shonte's physical location at the time of this appointment was obtained as well a phone number she could be reached at in the event of technical difficulties. Alga and this provider participated in today's telepsychological service. Of note, today's appointment was switched to a regular telephone call at 11:03am with Corlette's verbal consent due to technical issues.   This provider conducted a brief check-in. Chrisanna shared about recent events. This provider and Cheron reflected on what went well during Thanksgiving and what she could do differently for the upcoming celebrations. She acknowledged she is likely not eating enough protein. Further explored and processed. She was engaged in problem solving to increase protein intake. She agreed to eat smaller, more frequent meals; not wait too long before eating something when waking up; have protein with each snack/meal; and set reminders throughout the day to eat and journal food intake. Possible obstacles/barriers were explored and processed. Overall, Lachell was receptive to today's appointment as evidenced by openness to sharing, responsiveness to feedback, and willingness to  implement discussed strategies .  Mental Status Examination:  Appearance: neat Behavior: appropriate to circumstances Mood: neutral Affect: mood congruent Speech: WNL Eye Contact: appropriate Psychomotor Activity: WNL Gait: unable to assess Thought Process: linear, logical, and goal directed and no evidence or endorsement of suicidal, homicidal, and self-harm ideation, plan and intent  Thought Content/Perception: no hallucinations, delusions, bizarre thinking or behavior endorsed or observed Orientation: AAOx4 Memory/Concentration: memory, attention, language, and fund of knowledge intact  Insight: fair Judgment: fair  Interventions:  Conducted a brief chart review Provided empathic reflections and validation Employed supportive psychotherapy interventions to facilitate reduced distress and to improve coping skills with identified stressors Engaged patient in problem solving  DSM-5 Diagnosis(es):  F50.89 Other Specified Feeding or Eating Disorder, Emotional Eating Behaviors, F41.9 Unspecified Anxiety Disorder, and  F32.A Unspecified Depressive Disorder  Treatment Goal & Progress: During the initial appointment with this provider, the following treatment goal was established: increase coping skills. Amoy has demonstrated progress in her goal as evidenced by increased awareness of hunger patterns and increased awareness of triggers for emotional eating behaviors. Trinette also continues to demonstrate willingness to engage in learned skill(s).  Plan: The next appointment is scheduled for 01/15/2022 at 10am, which will be via MyChart Video Visit. The next session will focus on working towards the established treatment goal. Additionally, Sujata will continue with her primary therapist.

## 2022-01-07 ENCOUNTER — Ambulatory Visit (INDEPENDENT_AMBULATORY_CARE_PROVIDER_SITE_OTHER): Payer: BC Managed Care – PPO | Admitting: Behavioral Health

## 2022-01-07 ENCOUNTER — Telehealth (INDEPENDENT_AMBULATORY_CARE_PROVIDER_SITE_OTHER): Payer: BC Managed Care – PPO | Admitting: Family Medicine

## 2022-01-07 ENCOUNTER — Encounter (INDEPENDENT_AMBULATORY_CARE_PROVIDER_SITE_OTHER): Payer: Self-pay | Admitting: Family Medicine

## 2022-01-07 DIAGNOSIS — R7303 Prediabetes: Secondary | ICD-10-CM

## 2022-01-07 DIAGNOSIS — M25562 Pain in left knee: Secondary | ICD-10-CM | POA: Diagnosis not present

## 2022-01-07 DIAGNOSIS — E669 Obesity, unspecified: Secondary | ICD-10-CM

## 2022-01-07 DIAGNOSIS — F3289 Other specified depressive episodes: Secondary | ICD-10-CM | POA: Diagnosis not present

## 2022-01-07 DIAGNOSIS — F4323 Adjustment disorder with mixed anxiety and depressed mood: Secondary | ICD-10-CM | POA: Diagnosis not present

## 2022-01-07 DIAGNOSIS — Z6836 Body mass index (BMI) 36.0-36.9, adult: Secondary | ICD-10-CM

## 2022-01-07 MED ORDER — BUPROPION HCL ER (SR) 100 MG PO TB12: 100.0000 mg | ORAL_TABLET | Freq: Two times a day (BID) | ORAL | 0 refills | Status: DC

## 2022-01-07 NOTE — Progress Notes (Signed)
                Iline Buchinger L Trust Leh, [name] 

## 2022-01-07 NOTE — Progress Notes (Signed)
[facility] Counselor/Therapist Progress Note  Patient ID: Holly Larsen, MRN: [id number],    Date: 01/07/2022  Time Spent: 55 min Caregility video; Pt is home in private & Provider @ Home Office remotely   Treatment Type: Individual Therapy  Reported Symptoms: elevated anx/dep due to Family issues over the Holidays  Mental Status Exam: Appearance:  Casual     Behavior: Appropriate and Sharing  Motor: Normal  Speech/Language:  Clear and Coherent  Affect: Appropriate  Mood: normal  Thought process: normal  Thought content:   WNL  Sensory/Perceptual disturbances:   WNL  Orientation: oriented to person, place, and time/date  Attention: Good  Concentration: Good  Memory: WNL  Fund of knowledge:  Good  Insight:   Good  Judgment:  Good  Impulse Control: Good   Risk Assessment: Danger to Self:  No Self-injurious Behavior: No Danger to Others: No Duty to Warn:no Physical Aggression / Violence:No  Access to Firearms a concern: No  Gang Involvement:No   Subjective: Pt's Str has been especially harsh this year & accused her of not sending a formal invitation for Thanksgiving Dinner. Pt has tried to reconcile her Str's beh & her choice to be in a cult that is hurtful to the entire Family.   Pt suffered an injury to her knee near Thanksgiving & is trying to navigate the [place] w/this injury. Pt has realized she cannot communicate w/her Str that is toxic. It is not healthy.  Interventions: Family Systems  Diagnosis:Adjustment disorder with mixed anxiety and depressed mood  Plan: Holly Larsen is having health issues & she realizes her Family has many facets that are not healthy for her. She will distance herself from activities that prevent her from living her best health. She will find the strength to keep her boundaries.  Target Date: 02/07/2022  Progress: 2  Frequency: Twice monthly   Modality: Holly Larsen is listening less to her Str that is toxic. She will set new  & stronger boundaries to prevent her Str from harming her.  Target Date: 02/07/2022  Progress: 4  Frequency: Twice monthly   Modality: Holly Fraise, [name]

## 2022-01-15 ENCOUNTER — Telehealth (INDEPENDENT_AMBULATORY_CARE_PROVIDER_SITE_OTHER): Payer: BC Managed Care – PPO | Admitting: Psychology

## 2022-01-15 DIAGNOSIS — F32A Depression, unspecified: Secondary | ICD-10-CM

## 2022-01-15 DIAGNOSIS — F419 Anxiety disorder, unspecified: Secondary | ICD-10-CM | POA: Diagnosis not present

## 2022-01-15 DIAGNOSIS — F5089 Other specified eating disorder: Secondary | ICD-10-CM | POA: Diagnosis not present

## 2022-01-15 NOTE — Progress Notes (Signed)
  Office: [phone]  /  Fax: [phone]    Date: January 15, 2022    Appointment Start Time: 10:01am Duration: 30 minutes Provider: Lawerance Cruel, [name] Type of Session: Individual Therapy  Location of Patient: Home (private location) Location of Provider: Provider's Home (private office) Type of Contact: Telepsychological Visit via MyChart Video Visit  Session Content: Holly Larsen is a 61 y.o. female presenting for a follow-up appointment to address the previously established treatment goal of increasing coping skills.Today's appointment was a telepsychological visit. Holly Larsen provided verbal consent for today's telepsychological appointment and she is aware she is responsible for securing confidentiality on her end of the session. Prior to proceeding with today's appointment, Holly Larsen's physical location at the time of this appointment was obtained as well a phone number she could be reached at in the event of technical difficulties. Holly Larsen and this provider participated in today's telepsychological service.   This provider conducted a brief check-in. Ingris shared she was not "feeling too good this morning" due to a "bad dream" and "bad headache." She shared, "The food thing is going pretty good." Holly Larsen stated she sometimes "want[s] to run out to get something," which she attributed to the holiday season. Reviewed triggers for emotional eating behaviors. She acknowledged worry, stress, sadness, and squelch urgency continue to be triggers, but she described an overall reduction in emotional eating behaviors. Regarding protein intake (discussed at the last appointment), she noted, "It's getting better." Further explored and processed. She discussed implementing discussed strategies. Positive reinforcement was provided. Moreover, Holly Larsen was engaged in problem solving to develop a plan to help cope with urges/cravings involving activities to relax, activities to distract, comforting places, people to  call and connect with, and activities that help soothe senses. She was observed writing the plan. Overall, Holly Larsen was receptive to today's appointment as evidenced by openness to sharing, responsiveness to feedback, and willingness to implement discussed strategies .  Mental Status Examination:  Appearance: neat Behavior: appropriate to circumstances Mood: anxious Affect: mood congruent Speech: WNL Eye Contact: appropriate Psychomotor Activity: WNL Gait: unable to assess Thought Process: linear, logical, and goal directed and no evidence or endorsement of suicidal, homicidal, and self-harm ideation, plan and intent  Thought Content/Perception: no hallucinations, delusions, bizarre thinking or behavior endorsed or observed Orientation: AAOx4 Memory/Concentration: memory, attention, language, and fund of knowledge intact  Insight: fair Judgment: fair  Interventions:  Conducted a brief chart review Provided empathic reflections and validation Reviewed content from the previous session Provided positive reinforcement Employed supportive psychotherapy interventions to facilitate reduced distress and to improve coping skills with identified stressors Engaged patient in problem solving  DSM-5 Diagnosis(es):  F50.89 Other Specified Feeding or Eating Disorder, Emotional Eating Behaviors, F41.9 Unspecified Anxiety Disorder, and  F32.A Unspecified Depressive Disorder  Treatment Goal & Progress: During the initial appointment with this provider, the following treatment goal was established: increase coping skills. Holly Larsen has demonstrated progress in her goal as evidenced by increased awareness of hunger patterns, increased awareness of triggers for emotional eating behaviors, and reduction in emotional eating behaviors . Holly Larsen also continues to demonstrate willingness to engage in learned skill(s).  Plan: The next appointment is scheduled for 02/05/2022 at 2pm, which will be via MyChart Video Visit.  The next session will focus on working towards the established treatment goal. Holly Larsen will continue with her primary therapist.

## 2022-01-16 NOTE — Progress Notes (Signed)
TeleHealth Visit:  Due to the COVID-19 pandemic, this visit was completed with telemedicine (audio/video) technology to reduce patient and provider exposure as well as to preserve personal protective equipment.   Holly Larsen has verbally consented to this TeleHealth visit. The patient is located at home, the provider is located at the [employer] [facility] office. The participants in this visit include the listed provider and patient. The visit was conducted today via video.  Chief Complaint: OBESITY Holly Larsen is here to discuss her progress with her obesity treatment plan along with follow-up of her obesity related diagnoses. Holly Larsen is on the Category 2 Plan +85 protein and states she is following her eating plan approximately 50% of the time. Holly Larsen states she is not exercising due to to a sprained knee.  Today's visit was #: 6 Starting weight: 197 LBS Starting date: 08/27/2021  Interim History: Patient admits to getting off of meal plan with knee injury late November around Thanksgiving.  She plans to get back into water exercise and plans to join the [facility].  Her husband has been cooking dinners.  Subjective:   1. Left knee pain, unspecified chronicity S/P fall in November.  She reports swelling in her knee "giving way".  She is using a compression brace and has a walker.  She is using ice 2 times a day.  2. Pre-diabetes 12/10/2021.  A1c was 6.1.  She is actively working on weight loss and reducing sugar intake  3. Other depression, with emotional eating She is improving on Wellbutrin SR 100 mg twice daily.  The only side effect noted, was a dry mouth.  She has been practicing mindful eating.  Assessment/Plan:   1. Left knee pain, unspecified chronicity Knee pain limits walking which impairs her weight loss.  Schedule Ortho visit.  2. Pre-diabetes Consider metformin.  Recheck A1c in 4 to 6 months.  3. Other depression, with emotional eating Refill- buPROPion ER (WELLBUTRIN SR)  100 MG 12 hr tablet; Take 1 tablet (100 mg total) by mouth 2 (two) times daily.  Dispense: 60 tablet; Refill: 0  4. Obesity, current BMI 36.33 1.  Focus on lean protein with meals. 2.  Watch portion sizes with higher carb meals.  Holly Larsen is currently in the action stage of change. As such, her goal is to continue with weight loss efforts. She has agreed to the Category 2 Plan with 85 protein daily  Exercise goals:  Start water exercises.  Behavioral modification strategies: increasing lean protein intake, decreasing simple carbohydrates, increasing vegetables, increasing water intake, meal planning and cooking strategies, keeping healthy foods in the home, holiday eating strategies , and decreasing junk food.  Holly Larsen has agreed to follow-up with our clinic in 3- 5 4 weeks. She was informed of the importance of frequent follow-up visits to maximize her success with intensive lifestyle modifications for her multiple health conditions.  Objective:   VITALS: Per patient if applicable, see vitals. GENERAL: Alert and in no acute distress. CARDIOPULMONARY: No increased WOB. Speaking in clear sentences.  PSYCH: Pleasant and cooperative. Speech normal rate and rhythm. Affect is appropriate. Insight and judgement are appropriate. Attention is focused, linear, and appropriate.  NEURO: Oriented as arrived to appointment on time with no prompting.   No results found for: "CREATININE", "BUN", "NA", "K", "CL", "CO2" No results found for: "ALT", "AST", "GGT", "ALKPHOS", "BILITOT" Lab Results  Component Value Date   HGBA1C 6.1 (H) 12/10/2021   HGBA1C 6.2 (H) 08/27/2021   No results found for: "INSULIN"  No results found for: "TSH" No results found for: "CHOL", "HDL", "LDLCALC", "LDLDIRECT", "TRIG", "CHOLHDL" Lab Results  Component Value Date   VD25OH 44.7 08/27/2021   No results found for: "WBC", "HGB", "HCT", "MCV", "PLT" No results found for: "IRON", "TIBC", "FERRITIN"  Attestation Statements:    Reviewed by clinician on day of visit: allergies, medications, problem list, medical history, surgical history, family history, social history, and previous encounter notes.  I, Davy Pique, am acting as Location manager for Loyal Gambler, DO.  I have reviewed the above documentation for accuracy and completeness, and I agree with the above. Dell Ponto, DO

## 2022-01-21 ENCOUNTER — Ambulatory Visit: Payer: BC Managed Care – PPO | Admitting: Behavioral Health

## 2022-01-22 ENCOUNTER — Ambulatory Visit (INDEPENDENT_AMBULATORY_CARE_PROVIDER_SITE_OTHER): Payer: BC Managed Care – PPO | Admitting: Family Medicine

## 2022-01-29 ENCOUNTER — Ambulatory Visit (INDEPENDENT_AMBULATORY_CARE_PROVIDER_SITE_OTHER): Payer: BC Managed Care – PPO | Admitting: Behavioral Health

## 2022-01-29 DIAGNOSIS — F4323 Adjustment disorder with mixed anxiety and depressed mood: Secondary | ICD-10-CM

## 2022-01-29 NOTE — Progress Notes (Signed)
                Lakeya Mulka L Neiko Trivedi, [name] 

## 2022-01-29 NOTE — Progress Notes (Signed)
[facility] Counselor/Therapist Progress Note  Patient ID: Holly Larsen, MRN: [id number],    Date: 01/29/2022  Time Spent: 55 min Caregility video; Pt is home in private & Provider is working remote from [employer]   Treatment Type: Individual Therapy  Reported Symptoms: Elevated anx/dep due to considerations of her past during the Holidays. She is searching for the meaning of her life & what does G_d want from her.   Mental Status Exam: Appearance:  Casual     Behavior: Appropriate and Sharing  Motor: Normal  Speech/Language:  Clear and Coherent  Affect: Appropriate  Mood: normal  Thought process: normal  Thought content:   WNL  Sensory/Perceptual disturbances:   WNL  Orientation: oriented to person, place, and time/date  Attention: Good  Concentration: Good  Memory: WNL  Fund of knowledge:  Good  Insight:   Good  Judgment:  Good  Impulse Control: Good   Risk Assessment: Danger to Self:  No Self-injurious Behavior: No Danger to Others: No Duty to Warn:no Physical Aggression / Violence:No  Access to Firearms a concern: No  Gang Involvement:No   Subjective: Pt is in an existential state this morning. She is reviewing her life spending 10 yrs in the [employer], & other involvements her Str has w/a toxic cult organization. She is keeping her distance from her Sister Holly Larsen. She feels healthier & glad she can feel healing of her mind & body.  Pt is trying to find her voice & her power in her life. Her Husb Holly Larsen is very supportive & they live in a marital partnership.    Interventions: Insight-Oriented  Diagnosis:Adjustment disorder with mixed anxiety and depressed mood  Plan: Continue to keep your healthy distance from [facility] who has offered toxic elements to both [employer].  Target Date: 04/30/2022  Progress: 4  Frequency: Twice monthly  Modality: Holly Larsen is upset over her Str Holly Larsen attitude about her. She will stick to  her own moral compass & strengthen  those values, along w/her Husb Holly Larsen.   Target Date: 04/30/2022  Progress: 4  Frequency: Twice monthly  Modality: [name], [name]

## 2022-02-05 ENCOUNTER — Telehealth (INDEPENDENT_AMBULATORY_CARE_PROVIDER_SITE_OTHER): Payer: BC Managed Care – PPO | Admitting: Psychology

## 2022-02-05 ENCOUNTER — Ambulatory Visit (INDEPENDENT_AMBULATORY_CARE_PROVIDER_SITE_OTHER): Payer: BC Managed Care – PPO | Admitting: Family Medicine

## 2022-02-05 DIAGNOSIS — F419 Anxiety disorder, unspecified: Secondary | ICD-10-CM | POA: Diagnosis not present

## 2022-02-05 DIAGNOSIS — F32A Depression, unspecified: Secondary | ICD-10-CM

## 2022-02-05 DIAGNOSIS — F5089 Other specified eating disorder: Secondary | ICD-10-CM | POA: Diagnosis not present

## 2022-02-05 NOTE — Progress Notes (Signed)
  Office: [phone]  /  Fax: [phone]    Date: February 05, 2022    Appointment Start Time: 2:01pm Duration: 30 minutes Provider: Glennie Isle, [name] Type of Session: Individual Therapy  Location of Patient: Home (private location) Location of Provider: Provider's Home (private office) Type of Contact: Telepsychological Visit via MyChart Video Visit  Session Content: Holly Larsen is a 62 y.o. female presenting for a follow-up appointment to address the previously established treatment goal of increasing coping skills.Today's appointment was a telepsychological visit. Holly Larsen provided verbal consent for today's telepsychological appointment and she is aware she is responsible for securing confidentiality on her end of the session. Prior to proceeding with today's appointment, Holly Larsen's physical location at the time of this appointment was obtained as well a phone number she could be reached at in the event of technical difficulties. Holly Larsen and this provider participated in today's telepsychological service.   This provider conducted a brief check-in. Holly Larsen discussed challenges with "family dynamics" during the holidays resulting in challenges with her eating habits. Further explored and processed. To help cope with the aforementioned in the future, psychoeducation regarding the coping ahead strategy was provided. This provider walked Holly Larsen through the steps to help her practice the strategy to help reduce the likelihood of engagement in emotional eating behaviors in those situations. Holly Larsen was receptive to today's appointment as evidenced by openness to sharing, responsiveness to feedback, and willingness to implement discussed strategies .  Mental Status Examination:  Appearance: neat Behavior: appropriate to circumstances Mood: neutral Affect: mood congruent Speech: WNL Eye Contact: appropriate Psychomotor Activity: WNL Gait: unable to assess Thought Process: linear, logical, and goal  directed and no evidence or endorsement of suicidal, homicidal, and self-harm ideation, plan and intent  Thought Content/Perception: no hallucinations, delusions, bizarre thinking or behavior endorsed or observed Orientation: AAOx4 Memory/Concentration: memory, attention, language, and fund of knowledge intact  Insight: fair Judgment: fair  Interventions:  Conducted a brief chart review Provided empathic reflections and validation Provided positive reinforcement Employed supportive psychotherapy interventions to facilitate reduced distress and to improve coping skills with identified stressors Psychoeducation provided regarding cope ahead strategy   DSM-5 Diagnosis(es):  F50.89 Other Specified Feeding or Eating Disorder, Emotional Eating Behaviors, F41.9 Unspecified Anxiety Disorder, and  F32.A Unspecified Depressive Disorder  Treatment Goal & Progress: During the initial appointment with this provider, the following treatment goal was established: increase coping skills. Holly Larsen has demonstrated progress in her goal as evidenced by increased awareness of hunger patterns, increased awareness of triggers for emotional eating behaviors, and reduction in emotional eating behaviors . Holly Larsen also continues to demonstrate willingness to engage in learned skill(s).  Plan: The next appointment is scheduled for 02/19/2022 at 2pm, which will be via [place] Visit. The next session will focus on working towards the established treatment goal. Holly Larsen will continue with her primary therapist.

## 2022-02-12 ENCOUNTER — Ambulatory Visit (INDEPENDENT_AMBULATORY_CARE_PROVIDER_SITE_OTHER): Payer: BC Managed Care – PPO | Admitting: Behavioral Health

## 2022-02-12 DIAGNOSIS — F4323 Adjustment disorder with mixed anxiety and depressed mood: Secondary | ICD-10-CM | POA: Diagnosis not present

## 2022-02-12 NOTE — Progress Notes (Signed)
[facility] Counselor/Therapist Progress Note  Patient ID: CHESNI VOS, MRN: [id number],    Date: 02/12/2022  Time Spent: 80 min Caregility video; Pt is w/her [employer] & Provider is working remotely from [employer]   Treatment Type: Family with patient  Reported Symptoms: Reduction in anx/dep due to the location they have chosen near [place] Status Exam: Appearance:  Casual     Behavior: Appropriate and Sharing  Motor: Normal  Speech/Language:  Clear and Coherent  Affect: Appropriate  Mood: normal  Thought process: normal  Thought content:   WNL  Sensory/Perceptual disturbances:   WNL  Orientation: oriented to person, place, and time/date  Attention: Good  Concentration: Good  Memory: WNL  Fund of knowledge:  Good  Insight:   Good  Judgment:  Good  Impulse Control: Good   Risk Assessment: Danger to Self:  No Self-injurious Behavior: No Danger to Others: No Duty to Warn:no Physical Aggression / Violence:No  Access to Firearms a concern: No  Gang Involvement:No   Subjective: Ardell is so upbeat about her relationship w/her Husb since they have moved away from her & Husb [name]. The Cpl is trying to regain their bearings in the relationship w/o the infl of Str [place].  The Cpl is moving towards a more balanced relationship & being considered towards ea other.   Interventions: Family Systems  Diagnosis:Adjustment disorder with mixed anxiety and depressed mood  Plan: [name] & Sharee Pimple will cont to keep the balance & appreciation for ea other so they can facilitate open communication that strengthens the relationship.  Target Date: 03/15/2022  Progress: 5  Frequency: Twice monthly  Modality: Indiv & Cpl as indicated  Donnetta Hutching, [name]

## 2022-02-12 NOTE — Progress Notes (Signed)
                Rorik Vespa L Ariyona Eid, [name] 

## 2022-02-19 ENCOUNTER — Encounter (INDEPENDENT_AMBULATORY_CARE_PROVIDER_SITE_OTHER): Payer: Self-pay | Admitting: Family Medicine

## 2022-02-19 ENCOUNTER — Ambulatory Visit (INDEPENDENT_AMBULATORY_CARE_PROVIDER_SITE_OTHER): Payer: BC Managed Care – PPO | Admitting: Family Medicine

## 2022-02-19 ENCOUNTER — Telehealth (INDEPENDENT_AMBULATORY_CARE_PROVIDER_SITE_OTHER): Payer: Self-pay | Admitting: Psychology

## 2022-02-19 ENCOUNTER — Telehealth (INDEPENDENT_AMBULATORY_CARE_PROVIDER_SITE_OTHER): Payer: BC Managed Care – PPO | Admitting: Psychology

## 2022-02-19 VITALS — BP 136/84 | HR 83 | Temp 98.2°F | Ht 60.0 in | Wt 188.0 lb

## 2022-02-19 DIAGNOSIS — F3289 Other specified depressive episodes: Secondary | ICD-10-CM | POA: Diagnosis not present

## 2022-02-19 DIAGNOSIS — Z6836 Body mass index (BMI) 36.0-36.9, adult: Secondary | ICD-10-CM

## 2022-02-19 DIAGNOSIS — M25562 Pain in left knee: Secondary | ICD-10-CM | POA: Diagnosis not present

## 2022-02-19 DIAGNOSIS — R7303 Prediabetes: Secondary | ICD-10-CM

## 2022-02-19 DIAGNOSIS — E669 Obesity, unspecified: Secondary | ICD-10-CM

## 2022-02-19 DIAGNOSIS — F329 Major depressive disorder, single episode, unspecified: Secondary | ICD-10-CM

## 2022-02-19 MED ORDER — BUPROPION HCL ER (SR) 100 MG PO TB12: 100.0000 mg | ORAL_TABLET | Freq: Two times a day (BID) | ORAL | 0 refills | Status: DC

## 2022-02-19 NOTE — Telephone Encounter (Signed)
  Office: [phone]  /  Fax: [phone]  Date of Call: February 19, 2022  Time of Call: 2:03pm Provider: Glennie Isle, PsyD  CONTENT: This provider called [name] to check-in as she did not present for today's MyChart Video Visit appointment at 2pm. A HIPAA compliant voicemail was left requesting a call back. Of note, this provider stayed on the MyChart Video Visit appointment for 5 minutes prior to signing off per the clinic's grace period policy.    PLAN: This provider will wait for Diasha to call back. No further follow-up planned by this provider.

## 2022-02-19 NOTE — Progress Notes (Incomplete)
  Office: [phone]  /  Fax: [phone]    Date: February 19, 2022    Appointment Start Time: *** Duration: *** minutes Provider: Glennie Isle, [name] Type of Session: Individual Therapy  Location of Patient: {gbptloc:23249} (private location) Location of Provider: Provider's Home (private office) Type of Contact: Telepsychological Visit via MyChart Video Visit  Session Content: This provider called Holly Larsen at 2:03pm as she did not present for today's appointment. A HIPAA compliant voicemail was left requesting a call back.  As such, today's appointment was initiated *** minutes late.Holly Larsen is a 62 y.o. female presenting for a follow-up appointment to address the previously established treatment goal of increasing coping skills.Today's appointment was a telepsychological visit. Holly Larsen provided verbal consent for today's telepsychological appointment and she is aware she is responsible for securing confidentiality on her end of the session. Prior to proceeding with today's appointment, Holly Larsen's physical location at the time of this appointment was obtained as well a phone number she could be reached at in the event of technical difficulties. Holly Larsen and this provider participated in today's telepsychological service.   This provider conducted a brief check-in. *** Holly Larsen was receptive to today's appointment as evidenced by openness to sharing, responsiveness to feedback, and {gbreceptiveness:23401}.  Mental Status Examination:  Appearance: {Appearance:22431} Behavior: {Behavior:22445} Mood: {gbmood:21757} Affect: {Affect:22436} Speech: {Speech:22432} Eye Contact: {Eye Contact:22433} Psychomotor Activity: {Motor Activity:22434} Gait: {gbgait:23404} Thought Process: {thought process:22448}  Thought Content/Perception: {disturbances:22451} Orientation: {Orientation:22437} Memory/Concentration: {gbcognition:22449} Insight: {Insight:22446} Judgment: {Insight:22446}  Interventions:   {Interventions for Progress Notes:23405}  DSM-5 Diagnosis(es):  F50.89 Other Specified Feeding or Eating Disorder, Emotional Eating Behaviors, F41.9 Unspecified Anxiety Disorder, and  F32.A Unspecified Depressive Disorder  Treatment Goal & Progress: During the initial appointment with this provider, the following treatment goal was established: increase coping skills. Holly Larsen has demonstrated progress in her goal as evidenced by {gbtxprogress:22839}. Holly Larsen also {gbtxprogress2:22951}.  Plan: The next appointment is scheduled for *** at ***, which will be via MyChart Video Visit. The next session will focus on {Plan for Next Appointment:23400}. Holly Larsen will continue with her primary therapist.

## 2022-02-26 ENCOUNTER — Telehealth (INDEPENDENT_AMBULATORY_CARE_PROVIDER_SITE_OTHER): Payer: BC Managed Care – PPO | Admitting: Psychology

## 2022-02-26 ENCOUNTER — Ambulatory Visit (INDEPENDENT_AMBULATORY_CARE_PROVIDER_SITE_OTHER): Payer: BC Managed Care – PPO | Admitting: Behavioral Health

## 2022-02-26 DIAGNOSIS — F5089 Other specified eating disorder: Secondary | ICD-10-CM | POA: Diagnosis not present

## 2022-02-26 DIAGNOSIS — F32A Depression, unspecified: Secondary | ICD-10-CM | POA: Diagnosis not present

## 2022-02-26 DIAGNOSIS — F4323 Adjustment disorder with mixed anxiety and depressed mood: Secondary | ICD-10-CM

## 2022-02-26 DIAGNOSIS — F419 Anxiety disorder, unspecified: Secondary | ICD-10-CM | POA: Diagnosis not present
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
## 2022-02-26 NOTE — Progress Notes (Signed)
                Gavyn Ybarra L Dessire Grimes, [name] 

## 2022-02-26 NOTE — Progress Notes (Signed)
  Office: [phone]  /  Fax: [phone]    Date: February 26, 2022    Appointment Start Time: 8:36am Duration: 28 minutes Provider: Glennie Isle, [name] Type of Session: Individual Therapy  Location of Patient: Home (private location) Location of Provider: Provider's Home (private office) Type of Contact: Telepsychological Visit via MyChart Video Visit  Session Content: Holly Larsen is a 62 y.o. female presenting for a follow-up appointment to address the previously established treatment goal of increasing coping skills.Today's appointment was a telepsychological visit. Holly Larsen provided verbal consent for today's telepsychological appointment and she is aware she is responsible for securing confidentiality on her end of the session. Prior to proceeding with today's appointment, Holly Larsen's physical location at the time of this appointment was obtained as well a phone number she could be reached at in the event of technical difficulties. Virna and this provider participated in today's telepsychological service.   This provider conducted a brief check-in. Holly Larsen discussed staying home lately to avoid the cold. She noticed that while staying home, she has been "thinking about" unpleasant family situations. Briefly explored and processed. Holly Larsen explained she continues to discuss family conflicts with her primary therapist. She noted that while sadness can trigger a desire to eat, she is more mindful and that has reduced emotional eating behaviors. She noted, "I deserve to be healthy." To further assist with coping with emotional eating behaviors, Holly Larsen was introduced to a mindfulness exercise (PAUSE). She was observed writing notes. Furthermore, termination planning was discussed. Holly Larsen was receptive to a follow-up appointment in 3-4 weeks and an additional follow-up/termination appointment in 3-4 weeks after that. Overall, Holly Larsen was receptive to today's appointment as evidenced by openness to sharing,  responsiveness to feedback, and willingness to implement discussed strategies .  Mental Status Examination:  Appearance: neat Behavior: appropriate to circumstances Mood: neutral Affect: mood congruent Speech: WNL Eye Contact: appropriate Psychomotor Activity: WNL Gait: unable to assess Thought Process: linear, logical, and goal directed and no evidence or endorsement of suicidal, homicidal, and self-harm ideation, plan and intent  Thought Content/Perception: no hallucinations, delusions, bizarre thinking or behavior endorsed or observed Orientation: AAOx4 Memory/Concentration: memory, attention, language, and fund of knowledge intact  Insight: good Judgment: good  Interventions:  Conducted a brief chart review Provided empathic reflections and validation Provided positive reinforcement Employed supportive psychotherapy interventions to facilitate reduced distress and to improve coping skills with identified stressors Engaged patient in mindfulness exercise(s) Discussed termination planning  DSM-5 Diagnosis(es):  F50.89 Other Specified Feeding or Eating Disorder, Emotional Eating Behaviors, F41.9 Unspecified Anxiety Disorder, and  F32.A Unspecified Depressive Disorder  Treatment Goal & Progress: During the initial appointment with this provider, the following treatment goal was established: increase coping skills. Holly Larsen has demonstrated progress in her goal as evidenced by increased awareness of hunger patterns, increased awareness of triggers for emotional eating behaviors, and reduction in emotional eating behaviors . Holly Larsen also continues to demonstrate willingness to engage in learned skill(s).  Plan: The next appointment is scheduled for 03/18/2022 at 2:30pm, which will be via MyChart Video Visit. The next session will focus on working towards the established treatment goal. Holly Larsen will continue with her primary therapist.

## 2022-03-04 ENCOUNTER — Ambulatory Visit (INDEPENDENT_AMBULATORY_CARE_PROVIDER_SITE_OTHER): Payer: BC Managed Care – PPO | Admitting: Psychology

## 2022-03-04 ENCOUNTER — Encounter: Payer: Self-pay | Admitting: Psychology

## 2022-03-04 DIAGNOSIS — F411 Generalized anxiety disorder: Secondary | ICD-10-CM | POA: Diagnosis not present

## 2022-03-04 DIAGNOSIS — F331 Major depressive disorder, recurrent, moderate: Secondary | ICD-10-CM
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
## 2022-03-04 NOTE — Progress Notes (Signed)
                Elie Leppo, PhD 

## 2022-03-13 NOTE — Progress Notes (Signed)
Chief Complaint:   OBESITY Holly Larsen is here to discuss her progress with her obesity treatment plan along with follow-up of her obesity related diagnoses. Holly Larsen is on the Category 2 Plan+85 calories and states she is following her eating plan approximately 75% of the time. Holly Larsen states she is walking 45 minutes 2 times per week.  Today's visit was #: 7 Starting weight: 197 lbs Starting date: 08/27/2021 Today's weight: 188 lbs Today's date: 02/19/2022 Total lbs lost to date: 9 lbs Total lbs lost since last in-office visit: +2 lbs  Interim History: Got off track with the holidays, she drank more ETOH and ate more sweets.  Stress was high around her family.  Counseling has been helping.  Celebrating anniversary tomorrow.   Subjective:   1. Left knee pain, unspecified chronicity Improving.  Patient fell in late November. We discussed seeing orthopedics last visit but never did.   2. Pre-diabetes 12/10/2021, A1c was 6.1.  Reducing intake of starches and sweets.    3. Major depressive disorder, remission status unspecified, unspecified whether recurrent She is taking Wellbutrin SR 100 mg BID.  Her husband is supportive. Started couples counseling, it is helping.    4. Other depression, with emotional eating Has been seeing Dr Mallie Mussel for CBT.  She has been practicing mindful eating and seeing an outside counselor as well.   Assessment/Plan:   1. Left knee pain, unspecified chronicity Plans to see her PCP soon and start walking again.  Can use Tylenol arthritis 3 times per day as needed.   2. Pre-diabetes Add in exercise 3-4 per week.   3. Major depressive disorder, remission status unspecified, unspecified whether recurrent Refill - buPROPion ER (WELLBUTRIN SR) 100 MG 12 hr tablet; Take 1 tablet (100 mg total) by mouth 2 (two) times daily.  Dispense: 60 tablet; Refill: 0  4. Other depression, with emotional eating Continue counseling and mindful eating.   5.  Obesity,current BMI 36.7 [facility] in [place], has plans to check it out.  Continue counseling to help with emotional eating.   Holly Larsen is currently in the action stage of change. As such, her goal is to continue with weight loss efforts. She has agreed to the Category 2 Plan.   Exercise goals: All adults should avoid inactivity. Some physical activity is better than none, and adults who participate in any amount of physical activity gain some health benefits.  Behavioral modification strategies: increasing lean protein intake, increasing vegetables, increasing water intake, decreasing liquid calories, decreasing eating out, no skipping meals, meal planning and cooking strategies, keeping healthy foods in the home, ways to avoid boredom eating, emotional eating strategies, and avoiding temptations.  Holly Larsen has agreed to follow-up with our clinic in 4 weeks. She was informed of the importance of frequent follow-up visits to maximize her success with intensive lifestyle modifications for her multiple health conditions.   Objective:   Blood pressure 136/84, pulse 83, temperature 98.2 F (36.8 C), height 5' (1.524 m), weight 188 lb (85.3 kg), SpO2 97 %. Body mass index is 36.72 kg/m.  General: Cooperative, alert, well developed, in no acute distress. HEENT: Conjunctivae and lids unremarkable. Cardiovascular: Regular rhythm.  Lungs: Normal work of breathing. Neurologic: No focal deficits.   No results found for: "CREATININE", "BUN", "NA", "K", "CL", "CO2" No results found for: "ALT", "AST", "GGT", "ALKPHOS", "BILITOT" Lab Results  Component Value Date   HGBA1C 6.1 (H) 12/10/2021   HGBA1C 6.2 (H) 08/27/2021   No results found for: "INSULIN"  No results found for: "TSH" No results found for: "CHOL", "HDL", "LDLCALC", "LDLDIRECT", "TRIG", "CHOLHDL" Lab Results  Component Value Date   VD25OH 44.7 08/27/2021   No results found for: "WBC", "HGB", "HCT", "MCV", "PLT" No results found  for: "IRON", "TIBC", "FERRITIN"  Attestation Statements:   Reviewed by clinician on day of visit: allergies, medications, problem list, medical history, surgical history, family history, social history, and previous encounter notes.  I, Davy Pique, am acting as Location manager for Loyal Gambler, DO.  I have reviewed the above documentation for accuracy and completeness, and I agree with the above. Dell Ponto, DO

## 2022-03-18 ENCOUNTER — Telehealth (INDEPENDENT_AMBULATORY_CARE_PROVIDER_SITE_OTHER): Payer: BC Managed Care – PPO | Admitting: Psychology

## 2022-03-18 DIAGNOSIS — F5089 Other specified eating disorder: Secondary | ICD-10-CM | POA: Diagnosis not present

## 2022-03-18 DIAGNOSIS — F419 Anxiety disorder, unspecified: Secondary | ICD-10-CM

## 2022-03-18 DIAGNOSIS — F32A Depression, unspecified: Secondary | ICD-10-CM

## 2022-03-18 NOTE — Progress Notes (Signed)
  Office: [phone]  /  Fax: [phone]    Date: March 18, 2022    Appointment Start Time: 2:31pm Duration: 25 minutes Provider: Glennie Isle, [name] Type of Session: Individual Therapy  Location of Patient: Home (private location) Location of Provider: Provider's Home (private office) Type of Contact: Telepsychological Visit via MyChart Video Visit  Session Content: Holly Larsen is a 62 y.o. female presenting for a follow-up appointment to address the previously established treatment goal of increasing coping skills.Today's appointment was a telepsychological visit. [name] provided verbal consent for today's telepsychological appointment and she is aware she is responsible for securing confidentiality on her end of the session. Prior to proceeding with today's appointment, [name]'s physical location at the time of this appointment was obtained as well a phone number she could be reached at in the event of technical difficulties. [name] and this provider participated in today's telepsychological service.   This provider conducted a brief check-in. [name] shared about recent events, including her daughter needing brain surgery which has impacted her appetite. Associated thoughts and feelings were processed. Despite ongoing stressors, [name] discussed ensuring she is eating according her structured meal plan. Psychoeducation regarding the importance of self-care utilizing the oxygen mask metaphor was provided. Psychoeducation regarding pleasurable activities, including its impact on emotional eating and overall well-being was also provided. [name] was provided with a handout with various options of pleasurable activities, and was encouraged to engage in one activity a day and additional activities as needed when triggered to emotionally eat. [name] agreed. [name] provided verbal consent during today's appointment for this provider to send a handout with pleasurable activities via e-mail. Overall,  [name] was receptive to today's appointment as evidenced by openness to sharing, responsiveness to feedback, and willingness to engage in pleasurable activities to assist with coping.  Mental Status Examination:  Appearance: neat Behavior: appropriate to circumstances Mood: sad Affect: mood congruent Speech: WNL Eye Contact: appropriate Psychomotor Activity: WNL Gait: unable to assess Thought Process: linear, logical, and goal directed and no evidence or endorsement of suicidal, homicidal, and self-harm ideation, plan and intent  Thought Content/Perception: no hallucinations, delusions, bizarre thinking or behavior endorsed or observed Orientation: AAOx4 Memory/Concentration: memory, attention, language, and fund of knowledge intact  Insight: fair Judgment: fair  Interventions:  Conducted a brief chart review Provided empathic reflections and validation Employed supportive psychotherapy interventions to facilitate reduced distress and to improve coping skills with identified stressors Psychoeducation provided regarding pleasurable activities Psychoeducation provided regarding self-care  DSM-5 Diagnosis(es):  F50.89 Other Specified Feeding or Eating Disorder, Emotional Eating Behaviors, F41.9 Unspecified Anxiety Disorder, and  F32.A Unspecified Depressive Disorder  Treatment Goal & Progress: During the initial appointment with this provider, the following treatment goal was established: increase coping skills. [name] has demonstrated progress in her goal as evidenced by increased awareness of hunger patterns, increased awareness of triggers for emotional eating behaviors, and reduction in emotional eating behaviors . [name] also continues to demonstrate willingness to engage in learned skill(s).  Plan: [name] will be traveling to [place] to be with her daughter; therefore, the next appointment is scheduled for 05/06/2022 at 2:30pm, which will be via MyChart Video Visit. The next session  will focus on working towards the established treatment goal and termination. [name] will continue with her primary therapist.

## 2022-03-19 ENCOUNTER — Ambulatory Visit: Payer: BC Managed Care – PPO | Admitting: Psychology

## 2022-03-21 ENCOUNTER — Ambulatory Visit (INDEPENDENT_AMBULATORY_CARE_PROVIDER_SITE_OTHER): Payer: BC Managed Care – PPO | Admitting: Behavioral Health

## 2022-03-21 DIAGNOSIS — F411 Generalized anxiety disorder: Secondary | ICD-10-CM

## 2022-03-21 DIAGNOSIS — F331 Major depressive disorder, recurrent, moderate: Secondary | ICD-10-CM | POA: Diagnosis not present

## 2022-03-21 NOTE — Progress Notes (Signed)
[facility] Counselor/Therapist Progress Note  Patient ID: Holly Larsen, MRN: [id number],    Date: 03/21/2022  Time Spent: 70 min Caregility video; Pt is home in private & Provider @ home working remotely from [employer]   Treatment Type: Individual Therapy  Reported Symptoms: Elevated anx/dep due to recent notice of Holly Larsen's brain tumor  Mental Status Exam: Appearance:  Casual     Behavior: Appropriate and Sharing  Motor: Normal  Speech/Language:  Clear and Coherent  Affect: Appropriate  Mood: normal  Thought process: normal  Thought content:   WNL  Sensory/Perceptual disturbances:   WNL  Orientation: oriented to person, place, and time/date  Attention: Good  Concentration: Good  Memory: WNL  Fund of knowledge:  Good  Insight:   Good  Judgment:  Good  Impulse Control: Good   Risk Assessment: Danger to Self:  No Self-injurious Behavior: No Danger to Others: No Duty to Warn:no Physical Aggression / Violence:No  Access to Firearms a concern: No  Gang Involvement:No   Subjective: Pt is upset due to her Holly Larsen's recent Dx of brain cancer. Holly Larsen lives in [place] is travelling there to assist in her care.    Interventions: Solution-Oriented/Positive Psychology  Diagnosis:Generalized anxiety disorder  Major depressive disorder, recurrent episode, moderate ([place])  Plan: Holly Larsen is planning her travel to [place] & her visits w/Family. She is unsure when she will r/s for psychotherapy. Her Family needs her love & care & Pt is wanting to be present for her Holly Larsen & her Son. Balance your Holly Larsen's medical exp w/attn to the emot'l component.  Target Date: 04/19/2022  Progress: 5  Frequency: Twice monthly  Modality: Boykin Reaper, [name]

## 2022-03-21 NOTE — Progress Notes (Signed)
                Brennin Durfee L Petra Dumler, [name] 

## 2022-03-26 ENCOUNTER — Ambulatory Visit (INDEPENDENT_AMBULATORY_CARE_PROVIDER_SITE_OTHER): Payer: BC Managed Care – PPO | Admitting: Family Medicine

## 2022-03-27 ENCOUNTER — Ambulatory Visit: Payer: BC Managed Care – PPO | Admitting: Psychology

## 2022-05-06 ENCOUNTER — Telehealth (INDEPENDENT_AMBULATORY_CARE_PROVIDER_SITE_OTHER): Payer: BC Managed Care – PPO | Admitting: Psychology

## 2022-05-06 DIAGNOSIS — F5089 Other specified eating disorder: Secondary | ICD-10-CM | POA: Diagnosis not present

## 2022-05-06 DIAGNOSIS — F419 Anxiety disorder, unspecified: Secondary | ICD-10-CM | POA: Diagnosis not present

## 2022-05-06 DIAGNOSIS — F32A Depression, unspecified: Secondary | ICD-10-CM

## 2022-05-06 NOTE — Progress Notes (Signed)
  Office: [phone]  /  Fax: [phone]    Date: May 06, 2022    Appointment Start Time: 2:31pm Duration: 21 minutes Provider: Glennie Isle, [name] Type of Session: Individual Therapy  Location of Patient: Home (private location) Location of Provider: Provider's Home (private office) Type of Contact: Telepsychological Visit via MyChart Video Visit  Session Content: Holly Larsen is a 62 y.o. female presenting for a follow-up appointment to address the previously established treatment goal of increasing coping skills.Today's appointment was a telepsychological visit. Holly Larsen provided verbal consent for today's telepsychological appointment and she is aware she is responsible for securing confidentiality on her end of the session. Prior to proceeding with today's appointment, Holly Larsen's physical location at the time of this appointment was obtained as well a phone number she could be reached at in the event of technical difficulties. Holly Larsen and this provider participated in today's telepsychological service.   This provider conducted a brief check-in. Holly Larsen shared about recent events, adding she was sick with the norovirus. While she was in [place], she stated she engaged in making better choices and engaging in portion control despite learning about her daughter's cancer diagnosis. Associated thoughts and feelings were explored and processed. She also continues to report a reduction in emotional eating behaviors. Reflected on progress to date and reviewed learned skills. Overall, Holly Larsen was receptive to today's appointment as evidenced by openness to sharing, responsiveness to feedback, and willingness to continue engaging in learned skills.  Mental Status Examination:  Appearance: neat Behavior: appropriate to circumstances Mood: neutral Affect: mood congruent Speech: WNL Eye Contact: appropriate Psychomotor Activity: WNL Gait: unable to assess Thought Process: linear, logical, and goal  directed and no evidence or endorsement of suicidal, homicidal, and self-harm ideation, plan and intent  Thought Content/Perception: no hallucinations, delusions, bizarre thinking or behavior endorsed or observed Orientation: AAOx4 Memory/Concentration: memory, attention, language, and fund of knowledge intact  Insight: good Judgment: good  Interventions:  Conducted a brief chart review Provided empathic reflections and validation Provided positive reinforcement Employed supportive psychotherapy interventions to facilitate reduced distress and to improve coping skills with identified stressors Reviewed learned skills  DSM-5 Diagnosis(es):  F50.89 Other Specified Feeding or Eating Disorder, Emotional Eating Behaviors, F41.9 Unspecified Anxiety Disorder, and  F32.A Unspecified Depressive Disorder  Treatment Goal & Progress: During the initial appointment with this provider, the following treatment goal was established: increase coping skills. Holly Larsen demonstrated progress in her goal as evidenced by increased awareness of hunger patterns, increased awareness of triggers for emotional eating behaviors, and reduction in emotional eating behaviors . Holly Larsen also continues to demonstrate willingness to engage in learned skill(s).  Plan: As previously planned, today was [name]'s last appointment with this provider. She acknowledged understanding that she may request a follow-up appointment with this provider in the future as long as she is still established with the clinic. Holly Larsen will continue with her primary therapist. No further follow-up planned by this provider.

## 2022-05-08 ENCOUNTER — Ambulatory Visit: Payer: BC Managed Care – PPO | Admitting: Behavioral Health

## 2022-05-09 ENCOUNTER — Ambulatory Visit (INDEPENDENT_AMBULATORY_CARE_PROVIDER_SITE_OTHER): Payer: BC Managed Care – PPO | Admitting: Behavioral Health

## 2022-05-09 DIAGNOSIS — F411 Generalized anxiety disorder: Secondary | ICD-10-CM | POA: Diagnosis not present

## 2022-05-09 DIAGNOSIS — F331 Major depressive disorder, recurrent, moderate: Secondary | ICD-10-CM

## 2022-05-09 NOTE — Progress Notes (Signed)
                Issaac Shipper L Reiss Mowrey, [name] 

## 2022-05-09 NOTE — Progress Notes (Signed)
[facility] Counselor/Therapist Progress Note  Patient ID: Holly Larsen, MRN: [id number],    Date: 05/09/2022  Time Spent: 73 min Caregility video; Pt is home in private & Provider is working remotely from [employer]   Treatment Type: Individual Therapy  Reported Symptoms: Elevated anx/dep due to Dtr Holly Larsen's recent Dx of Brain cancer  Mental Status Exam: Appearance:  Casual     Behavior: Appropriate and Sharing  Motor: Normal  Speech/Language:  Clear and Coherent  Affect: Appropriate  Mood: normal  Thought process: normal  Thought content:   WNL  Sensory/Perceptual disturbances:   WNL  Orientation: oriented to person, place, and time/date  Attention: Good  Concentration: Good  Memory: WNL  Fund of knowledge:  Good  Insight:   Good  Judgment:  Good  Impulse Control: Good   Risk Assessment: Danger to Self:  No Self-injurious Behavior: No Danger to Others: No Duty to Warn:no Physical Aggression / Violence:No  Access to Firearms a concern: No  Gang Involvement:No   Subjective: Pt is dealing w/her Dtr's brain cancer. The Prognosis looks positive & her Husb Holly Larsen is supportive. Dtr's Husb is argumentative about the subj of 'G_d' when it happens & Pt is vocal about her encouragement of Pt's Dtr.   Interventions: Solution-Oriented/Positive Psychology and Family Systems  Diagnosis:Generalized anxiety disorder  Major depressive disorder, recurrent episode, moderate  Plan: Holly Larsen will realize her Dtr's Husb Holly Larsen is serious about helping her survive the cancer. Dtr's Husb is upset w/Pt due to her lens re: faith & belief that her Dtr will do fine. This clash btwn Family beliefs has caused a rift. Holly Larsen will take this situation in stride & offer what she can to/for her Dtr's health. She will cont to keep her own faith syst & do what she can to support her Dtr.   Target Date: 06/19/2022  Progress: 3  Frequency: Twice monthly  Modality: Boykin Reaper,  [name]

## 2022-05-21 ENCOUNTER — Telehealth (INDEPENDENT_AMBULATORY_CARE_PROVIDER_SITE_OTHER): Payer: BC Managed Care – PPO | Admitting: Family Medicine

## 2022-05-21 ENCOUNTER — Encounter (INDEPENDENT_AMBULATORY_CARE_PROVIDER_SITE_OTHER): Payer: Self-pay | Admitting: Family Medicine

## 2022-05-21 ENCOUNTER — Ambulatory Visit (INDEPENDENT_AMBULATORY_CARE_PROVIDER_SITE_OTHER): Payer: BC Managed Care – PPO | Admitting: Family Medicine

## 2022-05-21 DIAGNOSIS — E785 Hyperlipidemia, unspecified: Secondary | ICD-10-CM | POA: Diagnosis not present

## 2022-05-21 DIAGNOSIS — Z6836 Body mass index (BMI) 36.0-36.9, adult: Secondary | ICD-10-CM | POA: Diagnosis not present

## 2022-05-21 DIAGNOSIS — F3289 Other specified depressive episodes: Secondary | ICD-10-CM

## 2022-05-21 DIAGNOSIS — E669 Obesity, unspecified: Secondary | ICD-10-CM | POA: Diagnosis not present

## 2022-05-21 MED ORDER — BUPROPION HCL ER (SR) 100 MG PO TB12: 100.0000 mg | ORAL_TABLET | Freq: Two times a day (BID) | ORAL | 0 refills | Status: DC

## 2022-05-21 NOTE — Progress Notes (Signed)
TeleHealth Visit:  This visit was completed with telemedicine (audio/video) technology. Holly Larsen has verbally consented to this TeleHealth visit. The patient is located at home, the provider is located at home. The participants in this visit include the listed provider and patient. The visit was conducted today via MyChart video.  OBESITY Holly Larsen is here to discuss her progress with her obesity treatment plan along with follow-up of her obesity related diagnoses.   Today's visit was # 8 Starting weight: 197 lbs Starting date: 08/27/2021 Weight at last in office visit: 188 lbs on 02/19/22 Total weight loss: 9 lbs at last in office visit on 02/19/22. Today's reported weight (05/21/22):  186 lbs Reported BP 124/76, pulse 86  Nutrition Plan: the Category 2 plan   Current exercise:  walking   Interim History:  Holly Larsen's last in office visit was February 19, 2022. She was in [place] for a month to be with her daughter who is battling brain cancer. She has tried to make healthy choices rather than following cat 2 plan.  When she got back she and her husband had Noro virus. She is trying to get back to cat 2. She does not like the bread on the plan. She has skipped some meals recently. Appetite is low. She needs to lose weight to be approved for bilateral breast reduction.  She has to lose to 30 BMI-155 pounds.  She anticipates returning to [place] in June.  Assessment/Plan:  1. Other depression/emotional eating She has had increased stress recently due to her daughter's diagnosis. She ran out of her bupropion this past weekend. She feels that it helps with cravings and helps to stabilize her mood.  She has been "holding it together" for her daughter but since she got home she has been more depressed.  She is seeing [place] for counseling. Medication(s): Bupropion SR 100 mg twice daily  Plan: Refill bupropion 100 mg twice daily.  2. Hyperlipidemia Last lipid profile  07/10/2021: LDL above goal at 176, triglycerides elevated at 206, HDL normal at 66. Medication(s): Fenofibrate 145 mg daily.  Unable to take statin due to side effects.  No results found for: "CHOL", "HDL", "LDLCALC", "LDLDIRECT", "TRIG", "CHOLHDL" No results found for: "ALT", "AST", "GGT", "ALKPHOS", "BILITOT" The ASCVD Risk score (Arnett DK, et al., 2019) failed to calculate for the following reasons:   Cannot find a previous HDL lab   Cannot find a previous total cholesterol lab  Plan: Continue fenofibrate 145 mg daily. Continue to work on weight loss and increase exercise.   3. Generalized Obesity: Current BMI 36 Holly Larsen is currently in the action stage of change. As such, her goal is to continue with weight loss efforts.  She has agreed to the Category 2 plan.  Resume category 2.  Stick to plan as closely as possible.  Exercise goals:  sign up for water aerobics..  Set goal for exercising for 30 minutes 3 times per week.  Behavioral modification strategies: increasing lean protein intake, decreasing simple carbohydrates , and planning for success.  Milia has agreed to follow-up with our clinic in 3 weeks.   No orders of the defined types were placed in this encounter.   Medications Discontinued During This Encounter  Medication Reason   buPROPion ER (WELLBUTRIN SR) 100 MG 12 hr tablet Reorder     Meds ordered this encounter  Medications   buPROPion ER (WELLBUTRIN SR) 100 MG 12 hr tablet    Sig: Take 1 tablet (100 mg total) by mouth 2 (two)  times daily.    Dispense:  180 tablet    Refill:  0    Order Specific Question:   Supervising Provider    Answer:   Glennis Brink [[id number]]      Objective:   VITALS: Per patient if applicable, see vitals. GENERAL: Alert and in no acute distress. CARDIOPULMONARY: No increased WOB. Speaking in clear sentences.  PSYCH: Pleasant and cooperative. Speech normal rate and rhythm. Affect is appropriate. Insight and judgement are  appropriate. Attention is focused, linear, and appropriate.  NEURO: Oriented as arrived to appointment on time with no prompting.   Attestation Statements:   Reviewed by clinician on day of visit: allergies, medications, problem list, medical history, surgical history, family history, social history, and previous encounter notes.   This was prepared with the assistance of Engineer, civil (consulting).  Occasional wrong-word or sound-a-like substitutions may have occurred due to the inherent limitations of voice recognition software.

## 2022-05-30 ENCOUNTER — Ambulatory Visit (INDEPENDENT_AMBULATORY_CARE_PROVIDER_SITE_OTHER): Payer: BC Managed Care – PPO | Admitting: Behavioral Health

## 2022-05-30 DIAGNOSIS — F331 Major depressive disorder, recurrent, moderate: Secondary | ICD-10-CM

## 2022-05-30 DIAGNOSIS — F411 Generalized anxiety disorder: Secondary | ICD-10-CM | POA: Diagnosis not present

## 2022-05-30 NOTE — Progress Notes (Signed)
                Ennifer Harston L Everlena Mackley, [name] 

## 2022-05-30 NOTE — Progress Notes (Addendum)
[facility] Counselor/Therapist Progress Note  Patient ID: Holly Larsen, MRN: [id number],    Date: 05/30/2022  Time Spent: 45 min Caregility video; Pt is home in private & Provider working remotely from [employer]   Treatment Type: Individual Therapy  Reported Symptoms: Reduction in anx/dep & stress due to joyous occasion of Great Nephew's birth  Mental Status Exam: Appearance:  Casual     Behavior: Appropriate and Sharing  Motor: Normal  Speech/Language:  Clear and Coherent  Affect: Appropriate  Mood: normal  Thought process: normal  Thought content:   WNL  Sensory/Perceptual disturbances:   WNL  Orientation: oriented to person, place, and time/date  Attention: Good  Concentration: Good  Memory: WNL  Fund of knowledge:  Good  Insight:   Good  Judgment:  Good  Impulse Control: Good   Risk Assessment: Danger to Self:  No Self-injurious Behavior: No Danger to Others: No Duty to Warn:no Physical Aggression / Violence:No  Access to Firearms a concern: No  Gang Involvement:No   Subjective: Pt is excited today for her Str's GrandSon being born. She is missing her Dtr Holly Larsen who has brain cancer.   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 She & Husb Holly Larsen are doing well here & no longer argue about money. She has recently been told by her Dtr Holly Larsen that her Str has sent an inappropriate card wishing her well. The 4pp letter was sent by Pt's Str Holly Larsen; her [employer]. It was upsetting & disturbing to her Dtr. Holly Larsen will keep her Str & her Husb in perspective. She will ignore the latest letter writing & encourage her Dtr in all health healing issues.   Target Date: 07/04/2022  Progress: 5  Frequency: Once every 3-4 wks  Modality: Holly Fraise,  [name]

## 2022-06-07 ENCOUNTER — Other Ambulatory Visit: Payer: Self-pay | Admitting: Internal Medicine

## 2022-06-07 DIAGNOSIS — Z1231 Encounter for screening mammogram for malignant neoplasm of breast: Secondary | ICD-10-CM

## 2022-06-11 ENCOUNTER — Encounter (INDEPENDENT_AMBULATORY_CARE_PROVIDER_SITE_OTHER): Payer: Self-pay | Admitting: Family Medicine

## 2022-06-11 ENCOUNTER — Ambulatory Visit (INDEPENDENT_AMBULATORY_CARE_PROVIDER_SITE_OTHER): Payer: BC Managed Care – PPO | Admitting: Family Medicine

## 2022-06-11 ENCOUNTER — Other Ambulatory Visit (HOSPITAL_COMMUNITY): Payer: Self-pay

## 2022-06-11 VITALS — BP 129/85 | HR 83 | Temp 97.8°F | Ht 60.0 in | Wt 187.0 lb

## 2022-06-11 DIAGNOSIS — F329 Major depressive disorder, single episode, unspecified: Secondary | ICD-10-CM

## 2022-06-11 DIAGNOSIS — E669 Obesity, unspecified: Secondary | ICD-10-CM

## 2022-06-11 DIAGNOSIS — E559 Vitamin D deficiency, unspecified: Secondary | ICD-10-CM

## 2022-06-11 DIAGNOSIS — N62 Hypertrophy of breast: Secondary | ICD-10-CM | POA: Diagnosis not present

## 2022-06-11 DIAGNOSIS — R7303 Prediabetes: Secondary | ICD-10-CM | POA: Diagnosis not present

## 2022-06-11 DIAGNOSIS — Z6836 Body mass index (BMI) 36.0-36.9, adult: Secondary | ICD-10-CM

## 2022-06-11 MED ORDER — WEGOVY 0.25 MG/0.5ML ~~LOC~~ SOAJ
0.2500 mg | SUBCUTANEOUS | 0 refills | Status: DC
  Filled 2022-06-11 – 2022-07-04 (×4): qty 2, 28d supply, fill #0

## 2022-06-11 MED ORDER — BUPROPION HCL ER (SR) 150 MG PO TB12: 150.0000 mg | ORAL_TABLET | Freq: Two times a day (BID) | ORAL | 0 refills | Status: DC

## 2022-06-11 NOTE — Assessment & Plan Note (Signed)
Patient has an overall weight loss of 10 pounds in the past 9 months of medically supervised weight management.  Her weight loss has been slower than anticipated but she has missed some visits and had a prolonged trip to [place] due to her daughter's brain cancer diagnosis.  She is in good spirits and has a good support system at home.  She is ready get back on track and hopes to achieve a BMI of 30 to have breast reduction surgery.  She is scheduled for a visit with her plastic surgeon.  She is a good candidate for use of Wegovy 0.25 mg once weekly injection for obesity treatment. Continue prescribed dietary plan along with regular exercise Patient denies a personal or family history of pancreatitis, medullary thyroid carcinoma or multiple endocrine neoplasia type II. Recommend reviewing pen training video online.

## 2022-06-11 NOTE — Progress Notes (Signed)
Office: [phone]  /  Fax: [phone]  WEIGHT SUMMARY AND BIOMETRICS  Starting Date: 08/27/21  Starting Weight: 197lb   Weight Lost Since Last Visit: 1lb   Vitals Temp: 97.8 F (36.6 C) BP: 129/85 Pulse Rate: 83 SpO2: 98 %   Body Composition  Body Fat %: 44.8 % Fat Mass (lbs): 83.8 lbs Muscle Mass (lbs): 98 lbs Total Body Water (lbs): 70 lbs Visceral Fat Rating : 13     HPI  Chief Complaint: OBESITY  Holly Larsen is here to discuss her progress with her obesity treatment plan. She is on the the Category 2 Plan and states she is following her eating plan approximately 40 % of the time. She states she is exercising 30-60 minutes 2-3 times per week.   Interval History:  Since last office visit she is down 1 lb She was in [place] for a month Her daughter was diagnosed with brain cancer  She has been more emotional due to stressors.   She is eating less often on plan and snacking more at home Her husband continues to buy junk food She has not used an antiobesity medication Her net weight loss of 10 pounds in 9 months She did gain 1.4 pounds of muscle mass and lost 2.4 pounds of body fat in the past 4 months  Pharmacotherapy: None  PHYSICAL EXAM:  Blood pressure 129/85, pulse 83, temperature 97.8 F (36.6 C), height 5' (1.524 m), weight 187 lb (84.8 kg), SpO2 98 %. Body mass index is 36.52 kg/m.  General: She is overweight, cooperative, alert, well developed, and in no acute distress. PSYCH: Has normal mood, affect and thought process.   Lungs: Normal breathing effort, no conversational dyspnea.   ASSESSMENT AND PLAN  TREATMENT PLAN FOR OBESITY:  Recommended Dietary Goals  [name] is currently in the action stage of change. As such, her goal is to continue weight management plan. She has agreed to the Category 2 Plan.  Behavioral Intervention  We discussed the following Behavioral Modification Strategies today: increasing lean protein intake,  decreasing simple carbohydrates , increasing vegetables, increasing lower glycemic fruits, increasing fiber rich foods, increasing water intake, work on managing stress, creating time for self-care and relaxation measures, avoiding temptations and identifying enticing environmental cues, continue to practice mindfulness when eating, and planning for success.  Additional resources provided today: NA  Recommended Physical Activity Goals  [name] has been advised to work up to 150 minutes of moderate intensity aerobic activity a week and strengthening exercises 2-3 times per week for cardiovascular health, weight loss maintenance and preservation of muscle mass.   She has agreed to Think about ways to increase physical activity  Pharmacotherapy changes for the treatment of obesity: Wegovy 0.25 mg once weekly injection  ASSOCIATED CONDITIONS ADDRESSED TODAY  Major depressive disorder, remission status unspecified, unspecified whether recurrent Assessment & Plan: She has been struggling more with depressed mood and emotional eating since her daughter's brain cancer diagnosis.  She has a good support system at home.  She is seeing an outside counselor and Dr. Dewaine Conger.  She has been taking bupropion SR 100 mg twice daily.  She is noticing less control over emotional eating over the past month.  She denies adverse side effects.  Continue outside counseling and CBT with Dr. Dewaine Conger.  Continue working on stress reduction, proper nutrition and adequate sleep at night.  Increase bupropion SR to 150 mg twice daily.  Orders: -     buPROPion HCl ER (SR); Take 1 tablet (150  mg total) by mouth 2 (two) times daily.  Dispense: 60 tablet; Refill: 0  Macromastia Assessment & Plan: She is aiming for a BMI under 30 to have breast reduction surgery.  She is scheduled to see her plastic surgeon back Continue active plan for weight reduction   Generalized obesity: Starting BMI 38.4 Assessment & Plan: Patient has  an overall weight loss of 10 pounds in the past 9 months of medically supervised weight management.  Her weight loss has been slower than anticipated but she has missed some visits and had a prolonged trip to [place] due to her daughter's brain cancer diagnosis.  She is in good spirits and has a good support system at home.  She is ready get back on track and hopes to achieve a BMI of 30 to have breast reduction surgery.  She is scheduled for a visit with her plastic surgeon.  She is a good candidate for use of Wegovy 0.25 mg once weekly injection for obesity treatment. Continue prescribed dietary plan along with regular exercise Patient denies a personal or family history of pancreatitis, medullary thyroid carcinoma or multiple endocrine neoplasia type II. Recommend reviewing pen training video online.   Orders: -     Wegovy; Inject 0.25 mg into the skin once a week.  Dispense: 2 mL; Refill: 0  BMI 36.0-36.9,adult  Pre-diabetes Assessment & Plan: Lab Results  Component Value Date   HGBA1C 6.1 (H) 12/10/2021   Her last A1c was in the prediabetic range November 2023.  She has been working on reducing her intake of starches and sweets.  She has lost 10 pounds in the past 9 months of medically supervised weight management.  She has room for improvement with regular exercise.  She is currently not on metformin.  Upcoming labs are scheduled with her PCP early June.  If A1c is not done, plan to recheck in June.   Continue working on prescribed dietary plan, adding in 30 minutes of walking at least 3 days a week.   Vitamin D deficiency Assessment & Plan: She is currently on an over-the-counter vitamin D supplement, 5000 IU once daily.  Her energy level has been fair.  Her last set of labs was updated by [facility] physicians, not available for review.  She has upcoming labs scheduled with her PCP in early June. Last vitamin D Lab Results  Component Value Date   VD25OH 44.7 08/27/2021   We  discussed a target vitamin D level 50-70 to help with immune function, energy levels and leptin resistance.  If her vitamin D level is not done by her PCP at her upcoming visit, we will plan to recheck in June.       She was informed of the importance of frequent follow up visits to maximize her success with intensive lifestyle modifications for her multiple health conditions.   ATTESTASTION STATEMENTS:  Reviewed by clinician on day of visit: allergies, medications, problem list, medical history, surgical history, family history, social history, and previous encounter notes pertinent to obesity diagnosis.   I have personally spent 30 minutes total time today in preparation, patient care, nutritional counseling and documentation for this visit, including the following: review of clinical lab tests; review of medical tests/procedures/services.      Glennis Brink, DO DABFM, DABOM [facility] [address] [place], [place] [address] [phone]

## 2022-06-11 NOTE — Assessment & Plan Note (Signed)
She is currently on an over-the-counter vitamin D supplement, 5000 IU once daily.  Her energy level has been fair.  Her last set of labs was updated by [facility] physicians, not available for review.  She has upcoming labs scheduled with her PCP in early June. Last vitamin D Lab Results  Component Value Date   VD25OH 44.7 08/27/2021   We discussed a target vitamin D level 50-70 to help with immune function, energy levels and leptin resistance.  If her vitamin D level is not done by her PCP at her upcoming visit, we will plan to recheck in June.

## 2022-06-11 NOTE — Assessment & Plan Note (Signed)
Lab Results  Component Value Date   HGBA1C 6.1 (H) 12/10/2021   Her last A1c was in the prediabetic range November 2023.  She has been working on reducing her intake of starches and sweets.  She has lost 10 pounds in the past 9 months of medically supervised weight management.  She has room for improvement with regular exercise.  She is currently not on metformin.  Upcoming labs are scheduled with her PCP early June.  If A1c is not done, plan to recheck in June.   Continue working on prescribed dietary plan, adding in 30 minutes of walking at least 3 days a week.

## 2022-06-11 NOTE — Assessment & Plan Note (Signed)
She has been struggling more with depressed mood and emotional eating since her daughter's brain cancer diagnosis.  She has a good support system at home.  She is seeing an outside counselor and Dr. Dewaine Conger.  She has been taking bupropion SR 100 mg twice daily.  She is noticing less control over emotional eating over the past month.  She denies adverse side effects.  Continue outside counseling and CBT with Dr. Dewaine Conger.  Continue working on stress reduction, proper nutrition and adequate sleep at night.  Increase bupropion SR to 150 mg twice daily.

## 2022-06-11 NOTE — Assessment & Plan Note (Signed)
She is aiming for a BMI under 30 to have breast reduction surgery.  She is scheduled to see her plastic surgeon back Continue active plan for weight reduction

## 2022-06-14 ENCOUNTER — Other Ambulatory Visit (HOSPITAL_COMMUNITY): Payer: Self-pay

## 2022-06-20 ENCOUNTER — Ambulatory Visit (INDEPENDENT_AMBULATORY_CARE_PROVIDER_SITE_OTHER): Payer: BC Managed Care – PPO | Admitting: Behavioral Health

## 2022-06-20 DIAGNOSIS — F411 Generalized anxiety disorder: Secondary | ICD-10-CM

## 2022-06-20 DIAGNOSIS — F331 Major depressive disorder, recurrent, moderate: Secondary | ICD-10-CM | POA: Diagnosis not present

## 2022-06-20 NOTE — Progress Notes (Signed)
                Eola Waldrep L Keyasha Miah, [name] 

## 2022-06-20 NOTE — Progress Notes (Signed)
[facility] Counselor/Therapist Progress Note  Patient ID: Holly Larsen, MRN: [id number],    Date: 06/20/2022  Time Spent: 55 min Caregility video; Pt is home in private & Provider is remote from Home Office   Treatment Type: Individual Therapy  Reported Symptoms: Reduction in anx/dep since her Dtr is doing well  Mental Status Exam: Appearance:  Casual     Behavior: Appropriate and Sharing  Motor: Normal  Speech/Language:  Clear and Coherent  Affect: Appropriate  Mood: normal  Thought process: normal  Thought content:   WNL  Sensory/Perceptual disturbances:   WNL  Orientation: oriented to person, place, and time/date  Attention: Good  Concentration: Good  Memory: WNL  Fund of knowledge:  Good  Insight:   Good  Judgment:  Good  Impulse Control: Good   Risk Assessment: Danger to Self:  No Self-injurious Behavior: No Danger to Others: No Duty to Warn:no Physical Aggression / Violence:No  Access to Firearms a concern: No  Gang Involvement:No   Subjective: Pt is worried for the weather across the [name] today. She can get caught up in the media coverage easily. She enjoyed [name] & her Dtr Holly Larsen sent her facial products. Her Son's children made her something she has not rec'd yet.   Her [name] trip to [name] was meaningful. She is trying to venture into [name] attendance again. She is slowly moving away from her feelings of anger & betrayal surrounding the [name].    Her Dtr's cancer Tx is on hold this month. Her Son is planning to be a Engineer, materials @ [name].   Interventions: Solution-Oriented/Positive Psychology and Insight-Oriented  Diagnosis:Generalized anxiety disorder  Major depressive disorder, recurrent episode, moderate (HCC)  Plan: Pt likes the new Church she visited w/her [name]. She is taking it slow & relying on herself to recognize her limitations & not spreading herself too thin.  Target Date: 07/20/2022  Progress:  6  Frequency: Once every 3-4 wks  Modality: [name], [name]

## 2022-06-24 ENCOUNTER — Telehealth (INDEPENDENT_AMBULATORY_CARE_PROVIDER_SITE_OTHER): Payer: Self-pay | Admitting: Family Medicine

## 2022-06-24 ENCOUNTER — Encounter (INDEPENDENT_AMBULATORY_CARE_PROVIDER_SITE_OTHER): Payer: Self-pay | Admitting: *Deleted

## 2022-06-24 NOTE — Telephone Encounter (Signed)
Prior authorization done via cover my meds for patients. Wegovy. Waiting on determination.  

## 2022-06-25 ENCOUNTER — Other Ambulatory Visit (HOSPITAL_COMMUNITY): Payer: Self-pay

## 2022-06-25 NOTE — Addendum Note (Signed)
Addended by: Desirea Mizrahi L on: 06/25/2022 05:27 PM   Modules accepted: Level of Service  

## 2022-06-27 NOTE — Telephone Encounter (Signed)
Prior authorization denied for patients Wegovy. Patient and Dr. Cathey Endow notified.

## 2022-07-04 ENCOUNTER — Other Ambulatory Visit (HOSPITAL_COMMUNITY): Payer: Self-pay

## 2022-07-09 DIAGNOSIS — J029 Acute pharyngitis, unspecified: Secondary | ICD-10-CM | POA: Diagnosis not present

## 2022-07-09 DIAGNOSIS — N62 Hypertrophy of breast: Secondary | ICD-10-CM | POA: Diagnosis not present

## 2022-07-09 DIAGNOSIS — E7800 Pure hypercholesterolemia, unspecified: Secondary | ICD-10-CM | POA: Diagnosis not present

## 2022-07-09 DIAGNOSIS — E039 Hypothyroidism, unspecified: Secondary | ICD-10-CM | POA: Diagnosis not present

## 2022-07-09 DIAGNOSIS — E669 Obesity, unspecified: Secondary | ICD-10-CM | POA: Diagnosis not present

## 2022-07-10 ENCOUNTER — Institutional Professional Consult (permissible substitution): Payer: BC Managed Care – PPO | Admitting: Plastic Surgery

## 2022-07-10 NOTE — Progress Notes (Signed)
TeleHealth Visit:  This visit was completed with telemedicine (audio/video) technology. Holly Larsen has verbally consented to this TeleHealth visit. The patient is located at home, the provider is located at home. The participants in this visit include the listed provider and patient. The visit was conducted today via MyChart video.  OBESITY Holly Larsen is here to discuss her progress with her obesity treatment plan along with follow-up of her obesity related diagnoses.   Today's visit was # 10 Starting weight: 197 lbs Starting date: 08/27/21 Weight at last in office visit: 187 lbs on 06/11/22 Total weight loss: 10 lbs at last in office visit on 06/11/22. Today's reported weight (07/11/22): none reported  Nutrition Plan: the Category 2 plan - 50% adherence.  Current exercise:  walking 45 minutes 2 times per week.   Interim History:  Weight has been plateaued since September 23. She has been dealing with a great deal of stress due to daughter having brain cancer- she lives in [place]. Her daughter just went to [facility] on vacation during a break from chemo. She is doing great! She had recent good news- her friend is moving here from [place] and will be staying with her.  She on plan at breakfast.  Lunch- often skips and snacks instead Dinner- chicken and scalloped potatoes last night.  Denies excessive snacking but possibly eats > 200 snack cal per day Water intake is good. She plans on starting back to the [facility].  Wegovy prescribed last office visit but coverage was denied by insurance. Assessment/Plan:  1. Hypothyroidism Recent TSH elevated. Has low energy, headaches, fatigue.  Levothyroxine increased from 75 mcg to 88 mcg by PCP. TSH - 5.96 on 07/09/22 at PCP.  Plan: Continue levothyroxine at current dose. Follow up with PCP.  2. Other depression/emotional eating Holly Larsen has had issues with stress/emotional eating. Currently this is poorly controlled.  Notes fatigue- she feels it  may be related to thyroid. Does of bupropion increased By Dr. Cathey Endow last visit.  She feels this may be helping. Medication(s): Bupropion SR 150 mg twice daily Denies history of nephrolithiasis or glaucoma.  Plan: Continue Bupropion SR 150 mg twice daily Start Topamax 25 mg daily.  3. Generalized Obesity: Current BMI 36 Holly Larsen is currently in the action stage of change. As such, her goal is to continue with weight loss efforts.  She has agreed to the Category 2 plan.  1.  Discussed importance of of having regular meals and getting in all of the protein. 2.  She will begin counting snack calories and keeping them less than 200. 3.  She will have lunch daily.  Exercise goals: increase frequency of walking  Behavioral modification strategies: increasing lean protein intake, decreasing simple carbohydrates , no meal skipping, and planning for success.  Holly Larsen has agreed to follow-up with our clinic in 3 weeks.  No orders of the defined types were placed in this encounter.   Medications Discontinued During This Encounter  Medication Reason   COD LIVER OIL PO Patient Preference   levothyroxine (SYNTHROID) 75 MCG tablet Change in therapy   Semaglutide-Weight Management (WEGOVY) 0.25 MG/0.5ML SOAJ Cost of medication     Meds ordered this encounter  Medications   topiramate (TOPAMAX) 25 MG tablet    Sig: Take 1 tablet (25 mg total) by mouth daily.    Dispense:  30 tablet    Refill:  0    Order Specific Question:   Supervising Provider    Answer:   Carolin Sicks  Objective:   VITALS: Per patient if applicable, see vitals. GENERAL: Alert and in no acute distress. CARDIOPULMONARY: No increased WOB. Speaking in clear sentences.  PSYCH: Pleasant and cooperative. Speech normal rate and rhythm. Affect is appropriate. Insight and judgement are appropriate. Attention is focused, linear, and appropriate.  NEURO: Oriented as arrived to appointment on time with no prompting.    Attestation Statements:   Reviewed by clinician on day of visit: allergies, medications, problem list, medical history, surgical history, family history, social history, and previous encounter notes.   This was prepared with the assistance of Engineer, civil (consulting).  Occasional wrong-word or sound-a-like substitutions may have occurred due to the inherent limitations of voice recognition software.

## 2022-07-11 ENCOUNTER — Ambulatory Visit (INDEPENDENT_AMBULATORY_CARE_PROVIDER_SITE_OTHER): Payer: BC Managed Care – PPO | Admitting: Behavioral Health

## 2022-07-11 ENCOUNTER — Telehealth (INDEPENDENT_AMBULATORY_CARE_PROVIDER_SITE_OTHER): Payer: BC Managed Care – PPO | Admitting: Family Medicine

## 2022-07-11 ENCOUNTER — Encounter (INDEPENDENT_AMBULATORY_CARE_PROVIDER_SITE_OTHER): Payer: Self-pay | Admitting: Family Medicine

## 2022-07-11 DIAGNOSIS — F331 Major depressive disorder, recurrent, moderate: Secondary | ICD-10-CM | POA: Diagnosis not present

## 2022-07-11 DIAGNOSIS — E039 Hypothyroidism, unspecified: Secondary | ICD-10-CM | POA: Diagnosis not present

## 2022-07-11 DIAGNOSIS — F3289 Other specified depressive episodes: Secondary | ICD-10-CM

## 2022-07-11 DIAGNOSIS — Z6836 Body mass index (BMI) 36.0-36.9, adult: Secondary | ICD-10-CM

## 2022-07-11 DIAGNOSIS — E669 Obesity, unspecified: Secondary | ICD-10-CM

## 2022-07-11 DIAGNOSIS — F411 Generalized anxiety disorder: Secondary | ICD-10-CM | POA: Diagnosis not present

## 2022-07-11 MED ORDER — TOPIRAMATE 25 MG PO TABS: 25.0000 mg | ORAL_TABLET | Freq: Every day | ORAL | 0 refills | Status: DC

## 2022-07-11 NOTE — Progress Notes (Signed)
 [facility] Counselor/Therapist Progress Note  Patient ID: Holly Larsen, MRN: [id number],    Date: 07/11/2022  Time Spent: 55 min Caregility video; Pt is home in private & Provider is working remotely from [employer]   Treatment Type: Individual Therapy  Reported Symptoms: Reduction in anx/dep & stress due to Pt's letting go of her Str's control & micromgmt  Mental Status Exam: Appearance:  Casual     Behavior: Appropriate and Sharing  Motor: Normal  Speech/Language:  Clear and Coherent  Affect: Appropriate  Mood: normal  Thought process: normal  Thought content:   WNL  Sensory/Perceptual disturbances:   WNL  Orientation: oriented to person, place, and time/date  Attention: Good  Concentration: Good  Memory: WNL  Fund of knowledge:  Good  Insight:   Good  Judgment:  Good  Impulse Control: Good   Risk Assessment: Danger to Self:  No Self-injurious Behavior: No Danger to Others: No Duty to Warn:no Physical Aggression / Violence:No  Access to Firearms a concern: No  Gang Involvement:No   Subjective: Pt is having health status mgmt changes that are concerning her.    Interventions: Family Systems  Diagnosis:Generalized anxiety disorder  Major depressive disorder, recurrent episode, moderate (HCC)  Plan: Holly Larsen will move forward in her efforts to help others with an open heart. She will keep good boundaries & empower others w/her genuineness. She will take notes on her efforts in her Notebook.  Target Date: 08/04/2022  Progress: 5  Frequency: Once every 3-4 wks  Modality: [name], [name]

## 2022-07-11 NOTE — Progress Notes (Signed)
                Scout Gumbs L Yoel Kaufhold, [name] 

## 2022-07-15 ENCOUNTER — Ambulatory Visit
Admission: RE | Admit: 2022-07-15 | Discharge: 2022-07-15 | Disposition: A | Discharge status: Home / Self Care | Payer: BC Managed Care – PPO | Source: Ambulatory Visit | Attending: Internal Medicine | Admitting: Internal Medicine

## 2022-07-15 DIAGNOSIS — Z1231 Encounter for screening mammogram for malignant neoplasm of breast: Secondary | ICD-10-CM | POA: Diagnosis not present

## 2022-07-24 ENCOUNTER — Encounter: Payer: Self-pay | Admitting: Plastic Surgery

## 2022-07-24 ENCOUNTER — Ambulatory Visit (INDEPENDENT_AMBULATORY_CARE_PROVIDER_SITE_OTHER): Payer: BC Managed Care – PPO | Admitting: Plastic Surgery

## 2022-07-24 VITALS — BP 123/80 | HR 99 | Ht 60.0 in | Wt 191.2 lb

## 2022-07-24 DIAGNOSIS — N62 Hypertrophy of breast: Secondary | ICD-10-CM | POA: Diagnosis not present

## 2022-07-24 DIAGNOSIS — M546 Pain in thoracic spine: Secondary | ICD-10-CM

## 2022-07-24 DIAGNOSIS — Z6837 Body mass index (BMI) 37.0-37.9, adult: Secondary | ICD-10-CM | POA: Diagnosis not present

## 2022-07-24 DIAGNOSIS — M542 Cervicalgia: Secondary | ICD-10-CM | POA: Diagnosis not present

## 2022-07-24 NOTE — Progress Notes (Signed)
Referring Provider Pahwani, Kasandra Knudsen, MD [address] [place],  [place] [address]   CC:  Chief Complaint  Patient presents with   Advice Only      Holly Larsen is an 62 y.o. female.  HPI: Holly Larsen is a 62 year old female who has been previously seen in the office for upper back and neck pain secondary to macromastia.  She has previously requested a breast reduction and was felt to be a good candidate.  She has not followed up for the past 17 months and returns today for reevaluation.  She still has the same upper back and neck pain complaints.  She also states that her bra causes grooving in her shoulders.  She is still interested in a bilateral breast reduction.  Allergies  Allergen Reactions   Aleve [Naproxen]    Morphine And Codeine    Tylenol With Codeine #3 [Acetaminophen-Codeine]     Outpatient Encounter Medications as of 07/24/2022  Medication Sig   Acetylcysteine (NAC 600) 600 MG CAPS Take by mouth.   ALPHA-LIPOIC ACID PO Take 1,000 mg by mouth.   Ascorbic Acid (VITAMIN C) 1000 MG tablet Take 1,000 mg by mouth daily. With bioflavonoid   buPROPion (WELLBUTRIN SR) 150 MG 12 hr tablet Take 1 tablet (150 mg total) by mouth 2 (two) times daily.   Chromium 200 MCG CAPS Take by mouth.   fenofibrate (TRICOR) 145 MG tablet Take 145 mg by mouth daily.   levothyroxine (SYNTHROID) 88 MCG tablet Take 88 mcg by mouth daily before breakfast.   magnesium 30 MG tablet Take 30 mg by mouth 2 (two) times daily.   Melatonin 5 MG CAPS Take by mouth.   Methylsulfonylmethane (MSM) 1000 MG TABS Take by mouth.   QUERCETIN PO Take by mouth.   topiramate (TOPAMAX) 25 MG tablet Take 1 tablet (25 mg total) by mouth daily.   UNABLE TO FIND Med Name: Catalase   UNABLE TO FIND Med Name: Women's Securi-T (incontinence support)   Vitamin D, Cholecalciferol, 25 MCG (1000 UT) CAPS Take by mouth.   No facility-administered encounter medications on file as of 07/24/2022.     Past Medical  History:  Diagnosis Date   Anxiety    Arthritis    Back pain    Bilateral swelling of feet    Constipation    Depression    Fatigue    GERD (gastroesophageal reflux disease)    Hyperglycemia    Hyperlipidemia    Hypothyroidism    Joint pain    Thyroid disease    Vitamin D deficiency     Past Surgical History:  Procedure Laterality Date   cataract surgery Left 2017   CHOLECYSTECTOMY     FOOT SURGERY Left 2016   rous-en-y     ROUX-EN-Y PROCEDURE      No family history on file.  Social History   Social History Narrative   Not on file     Review of Systems General: Denies fevers, chills, weight loss CV: Denies chest pain, shortness of breath, palpitations Breast: Large breasts which the patient feels is contributing to her upper back and neck pain.  She denies any specific breast complaints other than the size.  Physical Exam    07/24/2022    1:37 PM 06/11/2022   12:00 PM 02/19/2022   10:00 AM  Vitals with BMI  Height 5\' 0"  5\' 0"  5\' 0"   Weight 191 lbs 3 oz 187 lbs 188 lbs  BMI 37.34  36.52 36.72  Systolic 123 129 643  Diastolic 80 85 84  Pulse 99 83 83    General:  No acute distress,  Alert and oriented, Non-Toxic, Normal speech and affect Breast: Patient has large breasts with grade 3 ptosis.  She has a sternal notch to nipple distance of 40 cm on the right and 40 cm on the left.  She has a fold to nipple distance of 20 cm on the right and 21 cm on the left.  There are no dominant masses and no nipple abnormalities.  She has no nipple discharge on exam today. Mammogram: June 2024 BI-RADS 1 Assessment/Plan Macromastia: The patient has large breasts and is requesting a bilateral breast reduction.  I believe I can remove 600 g per breast.  I discussed the procedure again with the patient.  I showed her where the incisions would be located.  We discussed the risks of bleeding, infection, and seroma formation.  We also discussed the possibility of nipple loss.  I believe  that the patient is a slightly higher risk for this due to the length of her breasts.  She understands that if there is evidence of compromise in the operating room that I will convert to a free nipple graft.  The patient was requesting information on what cup size she would be I had a long discussion with her regarding the fact that there is no standardized cup size for bras and that there is no standardized cup size or for breast reductions.  I will remove a sufficient amount of breast tissue to meet her insurance companies requirement and to provide her with relief from her macromastia.  I cannot guarantee any cup size.  She understands that I will use drains postoperatively.  We discussed the postoperative physical limitations including no heavy lifting greater than 20 pounds, no vigorous activity, no submerging incisions in water.  She understands the importance of ambulating the day of surgery to help decrease the risks of DVT.  All questions were answered to her satisfaction.  Photographs were obtained today with her consent.  Will submit her for a bilateral breast reduction at her request.  Holly Larsen 07/24/2022, 4:22 PM

## 2022-08-01 ENCOUNTER — Encounter (INDEPENDENT_AMBULATORY_CARE_PROVIDER_SITE_OTHER): Payer: Self-pay | Admitting: Family Medicine

## 2022-08-01 ENCOUNTER — Ambulatory Visit (INDEPENDENT_AMBULATORY_CARE_PROVIDER_SITE_OTHER): Payer: BC Managed Care – PPO | Admitting: Family Medicine

## 2022-08-01 VITALS — BP 133/87 | HR 99 | Temp 97.9°F | Ht 60.0 in | Wt 187.0 lb

## 2022-08-01 DIAGNOSIS — R7303 Prediabetes: Secondary | ICD-10-CM

## 2022-08-01 DIAGNOSIS — Z6836 Body mass index (BMI) 36.0-36.9, adult: Secondary | ICD-10-CM

## 2022-08-01 DIAGNOSIS — F5089 Other specified eating disorder: Secondary | ICD-10-CM

## 2022-08-01 DIAGNOSIS — E559 Vitamin D deficiency, unspecified: Secondary | ICD-10-CM

## 2022-08-01 DIAGNOSIS — E669 Obesity, unspecified: Secondary | ICD-10-CM

## 2022-08-01 DIAGNOSIS — F329 Major depressive disorder, single episode, unspecified: Secondary | ICD-10-CM | POA: Diagnosis not present

## 2022-08-01 MED ORDER — TOPIRAMATE 50 MG PO TABS: 50.0000 mg | ORAL_TABLET | Freq: Every day | ORAL | 0 refills | Status: DC

## 2022-08-01 NOTE — Assessment & Plan Note (Signed)
Lab Results  Component Value Date   HGBA1C 6.1 (H) 12/10/2021   She has been working on weight reduction, reducing her intake of carbs and sugar.  She has lost 10 pounds in the past 11 months of medically supervised weight management.  This is a 5% total body weight loss.  Continue working on [employer] and exercise. Recheck A1c today.  Consider use of metformin.

## 2022-08-01 NOTE — Assessment & Plan Note (Addendum)
Reviewed patient's overall progress.  She is down 10 pounds in the past 11 months of medically supervised weight management.  Her insurance has failed to cover antiobesity medication but she is making better strides with nutrition and physical activity.  She is working towards breast reduction surgery.  She is still motivated to continue working on healthy lifestyle changes.  Continue working on prescribed dietary plan, outside counseling and work on improving frequency of GYM visits.

## 2022-08-01 NOTE — Progress Notes (Signed)
Office: [phone]  /  Fax: [phone]  WEIGHT SUMMARY AND BIOMETRICS  Starting Date: 08/27/21  Starting Weight: 197lb   Weight Lost Since Last Visit: 0lb   Vitals Temp: 97.9 F (36.6 C) BP: 133/87 Pulse Rate: 99 SpO2: 96 %   Body Composition  Body Fat %: 45.3 % Fat Mass (lbs): 84.8 lbs Muscle Mass (lbs): 97.2 lbs Total Body Water (lbs): 72.4 lbs Visceral Fat Rating : 13     HPI  Chief Complaint: OBESITY  Holly Larsen is here to discuss her progress with her obesity treatment plan. She is on the Category 2 plan and states she is following her eating plan approximately 80 % of the time. She states she is walking 30 minutes 2 times per week.   Interval History:  Since last office visit she is down 0 lb She is down 0.8 lb of muscle mass and is up 1 lb of body fat in the past 7 weeks She is down a net 10 lb  in the past 11 mos of medically supervised weight management Her friend will be moving to [place] from [place] She is seeing Dr Dewaine Conger for CBT-- completed She met with Adah Salvage NP 6/6 and has follow-up in 3 weeks Her husband has been bringing home sweets She has done some emotional eating on Wellbutrin SR 150 bid She plans to resume gym workouts and will have a workout buddy when her friend moves here Started topiramate 25 mg in the morning for food impulse control  She is no longer skipping meals  Pharmacotherapy: Topiramate 25 mg every morning, Wellbutrin SR 150 mg twice daily  PHYSICAL EXAM:  Blood pressure 133/87, pulse 99, temperature 97.9 F (36.6 C), height 5' (1.524 m), weight 187 lb (84.8 kg), SpO2 96 %. Body mass index is 36.52 kg/m.  General: She is overweight, cooperative, alert, well developed, and in no acute distress. PSYCH: Has normal mood, affect and thought process.   Lungs: Normal breathing effort, no conversational dyspnea.   ASSESSMENT AND PLAN  TREATMENT PLAN FOR OBESITY:  Recommended Dietary Goals  [name] is currently in  the action stage of change. As such, her goal is to continue weight management plan. She has agreed to the Category 2 Plan.  Behavioral Intervention  We discussed the following Behavioral Modification Strategies today: increasing lean protein intake, decreasing simple carbohydrates , increasing vegetables, increasing lower glycemic fruits, increasing water intake, emotional eating strategies and understanding the difference between hunger signals and cravings, work on managing stress, creating time for self-care and relaxation measures, avoiding temptations and identifying enticing environmental cues, continue to practice mindfulness when eating, and planning for success.  Additional resources provided today: NA  Recommended Physical Activity Goals  [name] has been advised to work up to 150 minutes of moderate intensity aerobic activity a week and strengthening exercises 2-3 times per week for cardiovascular health, weight loss maintenance and preservation of muscle mass.   She has agreed to Start aerobic activity with a goal of 150 minutes a week at moderate intensity.   Pharmacotherapy changes for the treatment of obesity: Increase topiramate to 50 mg every morning  ASSOCIATED CONDITIONS ADDRESSED TODAY  Other specified eating disorder Assessment & Plan: She has completed cognitive behavioral therapy sessions with Dr. Dewaine Conger.  She is currently in couples counseling and is excited about her friend moving close by.  She is doing well eating on a schedule and working on improving her food choices.  She is still tempted by sweets, brought  in by her husband.  She has some slight improvements in food impulse control with the addition of topiramate 25 mg each morning without adverse side effect.  Continue to work on mindful eating, stress reduction and eating on a schedule.  Keep healthy foods on hand.  Keep junk food out of sight. Increase topiramate to 50 mg tab each morning.  Update chemistry  panel today.  Orders: -     Topiramate; Take 1 tablet (50 mg total) by mouth daily.  Dispense: 30 tablet; Refill: 0  Major depressive disorder, remission status unspecified, unspecified whether recurrent Assessment & Plan: She sees benefit of taking Wellbutrin SR 150 mg twice daily.  She has had some slight irritability.  Stress levels remain fairly high at home.  She is doing outside counseling.  Continue Wellbutrin SR 150 mg twice daily.  We discussed turning off the news, and keeping stress levels low and using exercise for stress reduction.   Generalized obesity: Starting BMI 38.4 Assessment & Plan: Reviewed patient's overall progress.  She is down 10 pounds in the past 11 months of medically supervised weight management.  Her insurance has failed to cover antiobesity medication but she is making better strides with nutrition and physical activity.  She is working towards breast reduction surgery.  She is still motivated to continue working on healthy lifestyle changes.  Continue working on prescribed dietary plan, outside counseling and work on improving frequency of GYM visits.   BMI 36.0-36.9,adult  Pre-diabetes Assessment & Plan: Lab Results  Component Value Date   HGBA1C 6.1 (H) 12/10/2021   She has been working on weight reduction, reducing her intake of carbs and sugar.  She has lost 10 pounds in the past 11 months of medically supervised weight management.  This is a 5% total body weight loss.  Continue working on [employer] and exercise. Recheck A1c today.  Consider use of metformin.  Orders: -     Hemoglobin A1c -     Comprehensive metabolic panel  Vitamin D deficiency Assessment & Plan: Last vitamin D Lab Results  Component Value Date   VD25OH 44.7 08/27/2021   She is currently not taking an additional vitamin D supplement.  Her most recent labs, obtained by [facility] physicians is not available for review today.  She is postmenopausal, due for DEXA scan every  2 years.  Will update her vitamin D level today with a target goal 50-70.   Orders: -     VITAMIN D 25 Hydroxy (Vit-D Deficiency, Fractures)      She was informed of the importance of frequent follow up visits to maximize her success with intensive lifestyle modifications for her multiple health conditions.   ATTESTASTION STATEMENTS:  Reviewed by clinician on day of visit: allergies, medications, problem list, medical history, surgical history, family history, social history, and previous encounter notes pertinent to obesity diagnosis.   I have personally spent 30 minutes total time today in preparation, patient care, nutritional counseling and documentation for this visit, including the following: review of clinical lab tests; review of medical tests/procedures/services.      Glennis Brink, DO DABFM, DABOM [facility] [address] [place], [place] [address] [phone]

## 2022-08-01 NOTE — Assessment & Plan Note (Signed)
She sees benefit of taking Wellbutrin SR 150 mg twice daily.  She has had some slight irritability.  Stress levels remain fairly high at home.  She is doing outside counseling.  Continue Wellbutrin SR 150 mg twice daily.  We discussed turning off the news, and keeping stress levels low and using exercise for stress reduction.

## 2022-08-01 NOTE — Assessment & Plan Note (Signed)
She has completed cognitive behavioral therapy sessions with Dr. Dewaine Conger.  She is currently in couples counseling and is excited about her friend moving close by.  She is doing well eating on a schedule and working on improving her food choices.  She is still tempted by sweets, brought in by her husband.  She has some slight improvements in food impulse control with the addition of topiramate 25 mg each morning without adverse side effect.  Continue to work on mindful eating, stress reduction and eating on a schedule.  Keep healthy foods on hand.  Keep junk food out of sight. Increase topiramate to 50 mg tab each morning.  Update chemistry panel today.

## 2022-08-01 NOTE — Assessment & Plan Note (Signed)
Last vitamin D Lab Results  Component Value Date   VD25OH 44.7 08/27/2021   She is currently not taking an additional vitamin D supplement.  Her most recent labs, obtained by [facility] physicians is not available for review today.  She is postmenopausal, due for DEXA scan every 2 years.  Will update her vitamin D level today with a target goal 50-70.

## 2022-08-02 ENCOUNTER — Ambulatory Visit: Payer: BC Managed Care – PPO | Admitting: Behavioral Health

## 2022-08-02 LAB — VITAMIN D 25 HYDROXY (VIT D DEFICIENCY, FRACTURES): Vit D, 25-Hydroxy: 34.6 ng/mL (ref 30.0–100.0)

## 2022-08-02 LAB — COMPREHENSIVE METABOLIC PANEL
ALT: 44 IU/L — ABNORMAL HIGH (ref 0–32)
AST: 32 IU/L (ref 0–40)
Albumin: 4.9 g/dL (ref 3.9–4.9)
Alkaline Phosphatase: 59 IU/L (ref 44–121)
BUN/Creatinine Ratio: 27 (ref 12–28)
BUN: 27 mg/dL (ref 8–27)
Bilirubin Total: 0.4 mg/dL (ref 0.0–1.2)
CO2: 20 mmol/L (ref 20–29)
Calcium: 10.7 mg/dL — ABNORMAL HIGH (ref 8.7–10.3)
Chloride: 107 mmol/L — ABNORMAL HIGH (ref 96–106)
Creatinine, Ser: 1 mg/dL (ref 0.57–1.00)
Globulin, Total: 2.6 g/dL (ref 1.5–4.5)
Glucose: 110 mg/dL — ABNORMAL HIGH (ref 70–99)
Potassium: 4.2 mmol/L (ref 3.5–5.2)
Sodium: 140 mmol/L (ref 134–144)
Total Protein: 7.5 g/dL (ref 6.0–8.5)
eGFR: 64 mL/min/{1.73_m2} (ref >59)

## 2022-08-02 LAB — HEMOGLOBIN A1C
Est. average glucose Bld gHb Est-mCnc: 128 mg/dL
Hgb A1c MFr Bld: 6.1 % — ABNORMAL HIGH (ref 4.8–5.6)

## 2022-08-16 DIAGNOSIS — E039 Hypothyroidism, unspecified: Secondary | ICD-10-CM | POA: Diagnosis not present
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
## 2022-08-22 ENCOUNTER — Encounter (INDEPENDENT_AMBULATORY_CARE_PROVIDER_SITE_OTHER): Payer: Self-pay | Admitting: Family Medicine

## 2022-08-22 ENCOUNTER — Telehealth (INDEPENDENT_AMBULATORY_CARE_PROVIDER_SITE_OTHER): Payer: BC Managed Care – PPO | Admitting: Family Medicine

## 2022-08-22 DIAGNOSIS — R7303 Prediabetes: Secondary | ICD-10-CM

## 2022-08-22 DIAGNOSIS — F5089 Other specified eating disorder: Secondary | ICD-10-CM

## 2022-08-22 DIAGNOSIS — Z6836 Body mass index (BMI) 36.0-36.9, adult: Secondary | ICD-10-CM

## 2022-08-22 DIAGNOSIS — F329 Major depressive disorder, single episode, unspecified: Secondary | ICD-10-CM | POA: Diagnosis not present

## 2022-08-22 DIAGNOSIS — E669 Obesity, unspecified: Secondary | ICD-10-CM

## 2022-08-22 DIAGNOSIS — E559 Vitamin D deficiency, unspecified: Secondary | ICD-10-CM

## 2022-08-22 MED ORDER — BUPROPION HCL ER (SR) 150 MG PO TB12
150.0000 mg | ORAL_TABLET | Freq: Two times a day (BID) | ORAL | 0 refills | Status: DC
Start: 2022-08-22 — End: 2022-09-19

## 2022-08-22 MED ORDER — TOPIRAMATE 50 MG PO TABS: 50.0000 mg | ORAL_TABLET | Freq: Every day | ORAL | 0 refills | Status: DC

## 2022-08-22 MED ORDER — VITAMIN D 50 MCG (2000 UT) PO CAPS
1.0000 | ORAL_CAPSULE | Freq: Every day | ORAL | Status: AC
Start: 2022-08-22 — End: ?

## 2022-08-23 ENCOUNTER — Ambulatory Visit (INDEPENDENT_AMBULATORY_CARE_PROVIDER_SITE_OTHER): Payer: BC Managed Care – PPO | Admitting: Behavioral Health

## 2022-08-23 DIAGNOSIS — F331 Major depressive disorder, recurrent, moderate: Secondary | ICD-10-CM

## 2022-08-23 DIAGNOSIS — F411 Generalized anxiety disorder: Secondary | ICD-10-CM
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
## 2022-08-23 NOTE — Progress Notes (Signed)
                Holly L Winstead, [name] 

## 2022-09-02 ENCOUNTER — Encounter: Payer: Self-pay | Admitting: Psychology

## 2022-09-02 ENCOUNTER — Ambulatory Visit (INDEPENDENT_AMBULATORY_CARE_PROVIDER_SITE_OTHER): Payer: BC Managed Care – PPO | Admitting: Psychology

## 2022-09-02 DIAGNOSIS — F331 Major depressive disorder, recurrent, moderate: Secondary | ICD-10-CM

## 2022-09-02 DIAGNOSIS — F411 Generalized anxiety disorder: Secondary | ICD-10-CM

## 2022-09-02 NOTE — Progress Notes (Signed)
[facility] Counselor/Therapist Progress Note  Patient ID: Holly Larsen, MRN: [id number],    Date: 09/02/2022  Time Spent: 12:30 - 3:00 pm   Treatment Type: Testing  Met with patient for testing session.  Patient was at the clinic and session was conducted from therapist's office in person.    Reported Symptoms/Reason for Referral: Patient presents with a history of excessive worry and recurrent depressed mood. 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 Testing recommended to evaluate for ADHD and Autism Spectrum Disorder along with other conditions that may be affecting her behavior and emotional state.   Mental Status Exam: Appearance:  Casual and Neatly Groomed     Behavior: Appropriate  Motor: Normal  Speech/Language:  Clear and Coherent and Normal Rate  Affect: Appropriate  Mood: euthymic  Thought process: Concrete - trouble understanding instructions  Thought content:   WNL  Sensory/Perceptual disturbances:   WNL  Orientation: oriented to person, place, time/date, and situation  Attention: Good  Concentration: Good  Memory: WNL  Fund of knowledge:  Fair  Insight:   Fair  Judgment:  Good  Impulse Control: Good   Risk Assessment: Danger to Self:  No Self-injurious Behavior: No Danger to Others: No Duty to Warn:no Physical Aggression / Violence:No   Subjective: Testing included the K-BIT 2 (1 hr. for testing and scoring) along with the CNS Vital signs (1 hs.), and SRS-2 (0.5 hrs).  Patient's husband was sent the SRS-2 Other report to complete online prior to next session.    Patient was cooperative and displayed good effort. Attention and concentration were adequate overall, although patient exhibited several instances of self-correction and asking questions to be  repeated.  She exhibited much difficulty understanding instructions and solving problems quickly, resulting in longer than expected administration times.  Mood was euthymic with appropriate affect overall, although patient became overly anxious and frustrated when making mistakes.  The results appear representative of current functioning.     Bryson Dames, PhD  Diagnosis:Major depressive disorder, recurrent episode, moderate (HCC)  Generalized anxiety disorder  Plan: Testing to continue next session with the ADOS 2 Module 4 and woodcock Johnson - IV reading tests (per patient request) followed by report writing and interactive feedback.     Bryson Dames, PhD

## 2022-09-02 NOTE — Progress Notes (Signed)
                STEVEN ALTABET, PhD 

## 2022-09-04 ENCOUNTER — Ambulatory Visit: Payer: BC Managed Care – PPO | Admitting: Psychology

## 2022-09-06 ENCOUNTER — Ambulatory Visit (INDEPENDENT_AMBULATORY_CARE_PROVIDER_SITE_OTHER): Payer: BC Managed Care – PPO | Admitting: Behavioral Health

## 2022-09-06 DIAGNOSIS — F411 Generalized anxiety disorder: Secondary | ICD-10-CM | POA: Diagnosis not present

## 2022-09-06 DIAGNOSIS — F331 Major depressive disorder, recurrent, moderate: Secondary | ICD-10-CM | POA: Diagnosis not present

## 2022-09-06 NOTE — Progress Notes (Signed)
                Holly Larsen L [name], [name] 
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
## 2022-09-09 ENCOUNTER — Ambulatory Visit (INDEPENDENT_AMBULATORY_CARE_PROVIDER_SITE_OTHER): Payer: BC Managed Care – PPO | Admitting: Psychology

## 2022-09-09 ENCOUNTER — Encounter: Payer: Self-pay | Admitting: Psychology

## 2022-09-09 DIAGNOSIS — F411 Generalized anxiety disorder: Secondary | ICD-10-CM

## 2022-09-09 DIAGNOSIS — F331 Major depressive disorder, recurrent, moderate: Secondary | ICD-10-CM

## 2022-09-09 NOTE — Progress Notes (Signed)
[facility] Counselor/Therapist Progress Note  Patient ID: Holly Larsen, MRN: [id number],    Date: 09/09/2022  Time Spent: 8:30 - 10:30 am   Treatment Type: Testing  Met with patient for testing session.  Patient was at the clinic and session was conducted from therapist's office in person.    Reported Symptoms/Reason for Referral: Patient presents with a history of excessive worry and recurrent depressed mood. 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 Testing recommended to evaluate for ADHD and Autism Spectrum Disorder along with other conditions that may be affecting her behavior and emotional state.   Mental Status Exam: Appearance:  Casual and Neatly Groomed     Behavior: Appropriate  Motor: Normal  Speech/Language:  Clear and Coherent and excessive talking  Affect: Appropriate  Mood: euthymic  Thought process: Concrete - trouble understanding instructions  Thought content:   WNL  Sensory/Perceptual disturbances:   WNL  Orientation: oriented to person, place, time/date, and situation  Attention: Good  Concentration: Good  Memory: WNL  Fund of knowledge:  Fair  Insight:   Fair - Good  Judgment:  Good  Impulse Control: Good   Risk Assessment: Danger to Self:  No Self-injurious Behavior: No Danger to Others: No Duty to Warn:no Physical Aggression / Violence:No   Subjective: Testing included the ADOS 2 Module 4 (2 hrs. for testing and scoring).  Holly Larsen not administered due patient excessively long to complete the ADOS-2 and the Woodcock-Johnson being optional.     Patient was cooperative and displayed good effort. Attention and concentration were adequate overall, although patient exhibited several instances of self-correction and asking questions to be  repeated.  She exhibited much difficulty understanding instructions and solving problems quickly, resulting in longer than expected administration times.  Mood was euthymic with appropriate affect overall, although patient became overly anxious and frustrated when making mistakes.  During social-emotional testing, patient spoke excessively, requiring the examiner to interrupt in order to be part of the conversation.  Social communication skills otherwise seemed adequate.  The results appear representative of current functioning.    Diagnosis:Major depressive disorder, recurrent episode, moderate (HCC)  Generalized anxiety disorder  Plan: Testing complete. Report writing to be conducted followed by interactive feedback next session.     Bryson Dames, PhD                  Bryson Dames, PhD

## 2022-09-11 ENCOUNTER — Encounter: Payer: Self-pay | Admitting: Psychology

## 2022-09-11 NOTE — Progress Notes (Signed)
Holly Larsen is a 62 y.o. female patient Report writing competed ( 3 hrs.).  Interactive feedback to be conducted next session. Report to be attached to the feedback progress note.  Patient/Guardian was advised Release of Information must be obtained prior to any record release in order to collaborate their care with an outside provider. Patient/Guardian was advised if they have not already done so to contact the registration department to sign all necessary forms in order for [name] to release information regarding their care.   Consent: Patient/Guardian gives verbal consent for treatment and assignment of benefits for services provided during this visit. Patient/Guardian expressed understanding and agreed to proceed.    Bryson Dames, PhD

## 2022-09-16 ENCOUNTER — Ambulatory Visit: Payer: BC Managed Care – PPO | Admitting: Psychology

## 2022-09-16 ENCOUNTER — Encounter: Payer: Self-pay | Admitting: Psychology

## 2022-09-16 DIAGNOSIS — F411 Generalized anxiety disorder: Secondary | ICD-10-CM

## 2022-09-16 DIAGNOSIS — F330 Major depressive disorder, recurrent, mild: Secondary | ICD-10-CM

## 2022-09-16 DIAGNOSIS — F4389 Other reactions to severe stress: Secondary | ICD-10-CM

## 2022-09-16 NOTE — Progress Notes (Signed)
                STEVEN ALTABET, PhD 

## 2022-09-16 NOTE — Progress Notes (Signed)
 [facility] Counselor/Therapist Progress Note  Patient ID: Holly Larsen, MRN: [id number],    Date: 09/16/2022  Time Spent: 4:00 - 4:40 pm   Treatment Type: Testing - Feedback Session  Met with patient to review results of testing.  Patient was at home and session was conducted from therapist's office via video conferencing.  Patient understood the limitations of video appointments and verbally consented to telehealth.       Reported Symptoms: Patient presents with a history of excessive worry and recurrent depressed mood. 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 Testing recommended to evaluate for ADHD and Autism Spectrum Disorder along with other conditions that may be affecting her behavior and emotional state.    Subjective: Interactive feedback was conducted (1 hr.).  It was discussed how patient did not meet the criterion for Autism Spectrum Disorder or ADHD along with how her previous trauma affects her ability to concentrate and regulate her behavior and emotion.  Recommendations included discussing results with PCP, considering medication changes, developing a visual organization system, and continuing individual counseling to work through her trauma.  Patient expressed agreement with the results and recommendations.     Total Time of Testing: 8.5 hrs. Testing and Scoring: 4.5 hrs. Interactive Feedback:1 hr. Report Writing: 3 hrs.   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  Plan: Report to be sent to parent and referring provider.     Bryson Dames, PhD

## 2022-09-16 NOTE — Progress Notes (Signed)
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 Testing recommended to evaluate for ADHD and Autism Spectrum Disorder along with other conditions that may be affecting her behavior and emotional state.  Test results indicated average overall intelligence (K-BIT 2R), with equally developed Verbal Comprehension and Nonverbal Reasoning.  Testing for neurocognitive processing indicated below average overall functioning with average functioning regarding attention for simple and complex activities.  Other processing measures, including memory, cognitive flexibility, and executive function were measured to be average on computerized measures, with the exception being slow motor speed, processing speed, and responsiveness along with very low rote memory.  These are often associated with depressed mood or anxiety in the absence of any neurological or physical impairment.  Ratings for behavior functioning indicated some attention and organization deficits, below clinically significant levels along with age typical functioning regarding hyperactivity, and impulse control.  Ratings of emotional functioning suggested minimal levels of obsessive-compulsive symptoms, with some but below clinically significant trauma  related symptoms, while depressed mood and anxiety were mild to moderately elevated.  Testing for ASD indicated some difficulty  with reciprocal social interaction, moistly excessive elaboration, along with restricted repetitive behavior (overly intense interests and strong sensory aversions) observed during testing.  Self-report ratings indicated normal functioning in the areas, while ratings from Ms. Diem's husband indicated normal social interaction but mildly elevated restricted repetitive behavior.  The results do not meet the criteria for ASD or ADHD, based on testing, ratings, and developmental history.  Current anxiety and depressed mood appear to be significant and recurrent.  A history of trauma was although reported, although current symptoms appear to be below clinically significant levels as well.  Recommendations include discussing results with Primary Care Physician or psychiatrist, continuing individual counseling, and accessing appropriate organizational and social supports.  See below for further recommendations.       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  Recommendations: Recommendations are to discuss results with Primary Care Physician or psychiatrist.  Continue medication to address anxiety and depressed mood.  Consider using SSRI's instead of the Wellbutrin for increased anxiety and mood reduction effects.  Individual counseling is recommended to continue frequency to help Ms. Rief with managing anxiety and depressed mood related to her trauma, social struggles, and health issues.  Ms. Mcalhany would benefit from a structured approach that focuses on the teaching of executive function compensation strategies along with Cognitive Behavior Therapy and Mindfulness and other  techniques that can help improve concentration, better regulate her speech, manage her worries, and address her trauma more deeply.  Mental alertness/energy can also be raised by increasing exercise, improving sleep, eating a healthy diet, and managing depression/stress.  Consult with a physician regarding any changes to physical regimen.   See attached handout regarding executive function strategies as a history of trauma can also cause impairment in this area.     Salvatore Decent Khye Hochstetler, Ph.D. Licensed Psychologist - [facility] [place] Licensed psychologist [phone]     Executive System Intervention Overview Executive dysfunction can significantly impact an individual's ability to function at home, at school, at work, or in the community. Several different approaches to executive function intervention have been developed by neuropsychologists, rehabilitation specialists, and others that are aimed at helping individuals cope with executive dysfunction. One type of intervention involves the application of cognitive remediation techniques that typically emphasize repeated practice with tasks, such as memory and attention tasks, that are intended to improve the deficient skill.   Another type of intervention involves teaching compensatory strategies. These strategies are designed to circumvent rather than directly improve deficits and also have demonstrated effectiveness in a number of patient populations. Still others emphasize the interaction of the individual within the environment and how antecedent environmental modifications or accommodations can facilitate executive functions. It should be noted that these approaches to dealing with executive dysfunction need not be mutually exclusive and many intervention programs are characterized by a hybrid approach.   Compensatory strategies themselves can take several forms including using external aides (e.g., use of a notebook), learning cognitive strategies (e.g.,  verbalization), and making environmental modifications (e.g., keeping workspace clutter-free). Research has demonstrated that both healthy adults as well as individuals who have executive deficits commonly rely on external aids for executive and other cognitive processes. The probability of success with compensatory strategies can be enhanced by building on an individual's existing strategies, systematically training the new strategies, and tailoring the compensatory strategies to  the individual's unique needs and environmental contexts. More frequent use of aides or strategies and the use of a greater variety of aides is helpful when it comes to memory, and this also may hold true for executive dysfunction.   Adherence to routines and resistance to change may reflect Ms. Hislop's need for predictability in her environment. An essential tenet of intervention is to facilitate feelings of security by maintaining a set of basic routines, then adjusting routines slightly in a stepwise fashion. Larger steps may provoke resistance and distress.  An individual who has difficulties shifting set can often adjust to changes in schedule or routine with the use of visual organizers such as pictures, schedules, planners, and calendar boards. This will let Ms. Jay know the order of activities for the day and can alert her to variations in the usual sequence of events before they occur.  Displaying a daily schedule and reviewing it at the outset of the day can help an adult like Ms. Mazurkiewicz anticipate the sequence of events and can serve as a useful reminder of any changes in her daily routine. Any changes in scheduled activities, persons, or events can be placed on Ms. Levier's schedule and called to her attention with as much advance notice as possible. This provides more time for her to adjust to the change.    An individual who has difficulties shifting attention often needs to focus on only one task at a time. Presenting  one task at a time and limiting choices to only one or two may be helpful.  Ms. Bakey might benefit from practicing her mental flexibility. Working with two or three familiar tasks and rotating them at regular intervals may help develop greater flexibility and help her become more accustomed to shifting.  Ms. Oboyle might benefit from external prompting to shift attention, behavior, or cognitive set from one activity or focus to the next. This may include, for example, the use of a timer on a watch or other device.  Some individuals can benefit from set time limits for each task before a shift to the next task is required.  Ms. Glazewski might work on one activity or assignment for a set period then an alternative activity for the next period. Use of a timer can facilitate Ms. Klausing's adjustment to change in activity.  Having regularly scheduled times during the day in which different tasks are carried out, separated by rest breaks or leisure activities, can facilitate shifting. For example, Ms. Kleinsasser might work on task A each morning and task B, each afternoon.  Sometimes working in small groups or being paired with another person can help individuals shift their focus or cognitive set. Others can model that it is time to change, cuing Ms. Selke by their behavior.  Individuals such as Ms. Kinlaw have difficulty monitoring the impact of their behavior on others. Having a supportive partner/therapist/teacher provide her with subtle cues to help recognize the effects of her behavior may be helpful. Similarly, providing opportunities for self-monitoring in contexts where she can receive constructive feedback, such as group therapy or social skills training, may also prove beneficial.  Ms. Vertucci may not be able to consider the impact of her behavior in the immediate situation. It may be helpful or necessary to discuss behavior removed from the situation.  It may be helpful to videotape Ms. Pastorino interacting socially and  then review it together. This allows her to see herself from another's perspective. Discussion of the videotape with someone, such  as a Veterinary surgeon or therapist, will be important. This method should be considered carefully and approached collaboratively with her. Although videotaping can be a powerful tool, there also is potential for emotional consequences and negative effects on self-esteem.  A Social Support group may be a helpful venue to increase Ms. Segreto's awareness of the impact her behavior has on others. This can provide not only direct skill training but also an opportunity for helpful feedback from a counselor or peers in a safe setting.  Some individuals like Ms. Levy may benefit from receiving reading material related to the nature and causes of their cognitive and behavioral problems. The provision of articles or "fact sheets" can help improve awareness, increase responsiveness to more direct interventions, and facilitate understanding between individuals with cognitive deficits and their caregivers, family members, and supervisors.                  Bryson Dames, PhD

## 2022-09-17 ENCOUNTER — Ambulatory Visit (INDEPENDENT_AMBULATORY_CARE_PROVIDER_SITE_OTHER): Payer: BC Managed Care – PPO | Admitting: Family Medicine

## 2022-09-17 ENCOUNTER — Encounter (INDEPENDENT_AMBULATORY_CARE_PROVIDER_SITE_OTHER): Payer: Self-pay

## 2022-09-18 NOTE — Progress Notes (Signed)
TeleHealth Visit:  This visit was completed with telemedicine (audio/video) technology. Holly Larsen has verbally consented to this TeleHealth visit. The patient is located at home, the provider is located at home. The participants in this visit include the listed provider and patient. The visit was conducted today via MyChart video.  Holly Larsen is here to discuss her progress with her Holly treatment plan along with follow-up of her Holly related diagnoses.   Today's visit was # 12 Starting weight: 197 lbs Starting date: 08/27/21 Weight at last in office visit: 187 lbs on 08/01/22 Total weight loss: 10 lbs at last in office visit on 08/01/22. Today's reported weight (09/19/22):  186 lbs  Nutrition Plan: the Category 2 plan  Current exercise:  some walking  Interim History:  She has been struggling with her depression quite a bit. 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 She recently had Botox for this which has worked for her. She follows cat 2 at breakfast and lunch. Sometimes skips breakfast due to sleeping late but makes up the protein. She plans to start water aerobics this week for exercise. She may alternate using the weight room.    Assessment/Plan:  1. Major depressive disorder She feels she is having delayed anger reaction to recent issue with a friend.  Her daughter has been battling brain cancer. She is seeing a therapist regularly which she feels has been beneficial. No SI/HI. She says her daughter has been on Lexapro which has worked wonders for her. Medication(s): Bupropion SR 150 mg twice daily She has started back to church which she feels is a positive thing.  Plan: Continue and refill Bupropion SR 150 mg twice daily Start Lexapro 5 mg daily. Encouraged her to begin regular exercise and continue social activities through church.   2. Eating disorder/emotional eating [name] has had issues with stress/emotional eating. Currently  this is well controlled.  Mood has been depressed. Medication(s): Bupropion SR 150 mg twice daily, Topamax 50 mg daily.  Plan: Continue and refill Bupropion SR 150 mg twice daily Continue Bupropion SR 150 mg twice daily  3. Generalized Holly: Current BMI 36 Neco is currently in the action stage of change. As such, her goal is to continue with weight loss efforts.  She has agreed to the Category 2 plan.  Exercise goals:  begin water aerobics and weight training   Behavioral modification strategies: increasing lean protein intake, decreasing simple carbohydrates , and planning for success.  [name] has agreed to follow-up with our clinic in 8 weeks.  No orders of the defined types were placed in this encounter.   Medications Discontinued During This Encounter  Medication Reason   topiramate (TOPAMAX) 50 MG tablet Reorder   buPROPion (WELLBUTRIN SR) 150 MG 12 hr tablet Reorder   escitalopram (LEXAPRO) 5 MG tablet Reorder   topiramate (TOPAMAX) 50 MG tablet Reorder     Meds ordered this encounter  Medications   DISCONTD: escitalopram (LEXAPRO) 5 MG tablet    Sig: Take 1 tablet (5 mg total) by mouth daily.    Dispense:  30 tablet    Refill:  0    Order Specific Question:   Supervising Provider    Answer:   Carolin Sicks   DISCONTD: topiramate (TOPAMAX) 50 MG tablet    Sig: Take 1 tablet (50 mg total) by mouth daily.    Dispense:  30 tablet    Refill:  0    Order Specific Question:   Supervising Provider  Answer:   Glennis Brink [[id number]]   topiramate (TOPAMAX) 50 MG tablet    Sig: Take 1 tablet (50 mg total) by mouth daily.    Dispense:  90 tablet    Refill:  0    Order Specific Question:   Supervising Provider    Answer:   Glennis Brink [[id number]]   escitalopram (LEXAPRO) 5 MG tablet    Sig: Take 1 tablet (5 mg total) by mouth daily.    Dispense:  90 tablet    Refill:  0    Order Specific Question:   Supervising Provider    Answer:   Glennis Brink [[id number]]    buPROPion (WELLBUTRIN SR) 150 MG 12 hr tablet    Sig: Take 1 tablet (150 mg total) by mouth 2 (two) times daily.    Dispense:  180 tablet    Refill:  0    Order Specific Question:   Supervising Provider    Answer:   Glennis Brink [[id number]]      Objective:   VITALS: Per patient if applicable, see vitals. GENERAL: Alert and in no acute distress. CARDIOPULMONARY: No increased WOB. Speaking in clear sentences.  PSYCH: Pleasant and cooperative. Speech normal rate and rhythm. Affect is appropriate. Insight and judgement are appropriate. Attention is focused, linear, and appropriate.  NEURO: Oriented as arrived to appointment on time with no prompting.   Attestation Statements:   Reviewed by clinician on day of visit: allergies, medications, problem list, medical history, surgical history, family history, social history, and previous encounter notes.   This was prepared with the assistance of Engineer, civil (consulting).  Occasional wrong-word or sound-a-like substitutions may have occurred due to the inherent limitations of voice recognition software.

## 2022-09-19 ENCOUNTER — Encounter (INDEPENDENT_AMBULATORY_CARE_PROVIDER_SITE_OTHER): Payer: Self-pay | Admitting: Family Medicine

## 2022-09-19 ENCOUNTER — Telehealth (INDEPENDENT_AMBULATORY_CARE_PROVIDER_SITE_OTHER): Payer: BC Managed Care – PPO | Admitting: Family Medicine

## 2022-09-19 DIAGNOSIS — Z6836 Body mass index (BMI) 36.0-36.9, adult: Secondary | ICD-10-CM | POA: Diagnosis not present

## 2022-09-19 DIAGNOSIS — F329 Major depressive disorder, single episode, unspecified: Secondary | ICD-10-CM | POA: Diagnosis not present

## 2022-09-19 DIAGNOSIS — F5089 Other specified eating disorder: Secondary | ICD-10-CM | POA: Diagnosis not present

## 2022-09-19 DIAGNOSIS — E669 Obesity, unspecified: Secondary | ICD-10-CM

## 2022-09-19 MED ORDER — ESCITALOPRAM OXALATE 5 MG PO TABS
5.0000 mg | ORAL_TABLET | Freq: Every day | ORAL | 0 refills | Status: DC
Start: 2022-09-19 — End: 2022-09-19

## 2022-09-19 MED ORDER — TOPIRAMATE 50 MG PO TABS
50.0000 mg | ORAL_TABLET | Freq: Every day | ORAL | 0 refills | Status: AC
Start: 2022-09-19 — End: ?

## 2022-09-19 MED ORDER — ESCITALOPRAM OXALATE 5 MG PO TABS
5.0000 mg | ORAL_TABLET | Freq: Every day | ORAL | 0 refills | Status: DC
Start: 2022-09-19 — End: 2022-11-14

## 2022-09-19 MED ORDER — BUPROPION HCL ER (SR) 150 MG PO TB12
150.0000 mg | ORAL_TABLET | Freq: Two times a day (BID) | ORAL | 0 refills | Status: DC
Start: 2022-09-19 — End: 2022-11-14

## 2022-09-19 MED ORDER — TOPIRAMATE 50 MG PO TABS: 50.0000 mg | ORAL_TABLET | Freq: Every day | ORAL | 0 refills | Status: DC

## 2022-09-24 ENCOUNTER — Telehealth: Payer: Self-pay | Admitting: *Deleted

## 2022-09-24 NOTE — Telephone Encounter (Signed)
Lvm to schedule surgery

## 2022-09-24 NOTE — Progress Notes (Signed)
Patient ID: Holly Larsen, female    DOB: [birth year], 62 y.o.   MRN: [id number]  No chief complaint on file.     ICD-10-CM   1. Macromastia  N62     2. Back pain of thoracolumbar region  M54.50    M54.6     3. Postural kyphosis, thoracic region  M40.04        History of Present Illness: Holly Larsen is a 62 y.o.  female  with a history of macromastia.  She presents for preoperative evaluation for upcoming procedure, Bilateral Breast Reduction, scheduled for 10/08/2022 with Dr.  Ladona Ridgel  The patient {HAS HAS [id number]} had problems with anesthesia. ***  Summary of Previous Visit: Patient is higher risk for nipple loss due to the length of her breast.  She understands that conversion to free nipple graft may be necessary.  STN is 40 cm bilaterally and 20 cm fold the nipple on the right and 21 cm on the left.  Estimated excess breast tissue to be removed at time of surgery: 600 grams  Job: ***  PMH Significant for: Macromastia, Anxiety, GERD, HLD, Hypothyroidism, Vit D Deficiency.    Past Medical History: Allergies: Allergies  Allergen Reactions   Aleve [Naproxen]    Morphine And Codeine    Tylenol With Codeine #3 [Acetaminophen-Codeine]     Current Medications:  Current Outpatient Medications:    Acetylcysteine (NAC 600) 600 MG CAPS, Take by mouth., Disp: , Rfl:    ALPHA-LIPOIC ACID PO, Take 1,000 mg by mouth., Disp: , Rfl:    Ascorbic Acid (VITAMIN C) 1000 MG tablet, Take 1,000 mg by mouth daily. With bioflavonoid, Disp: , Rfl:    buPROPion (WELLBUTRIN SR) 150 MG 12 hr tablet, Take 1 tablet (150 mg total) by mouth 2 (two) times daily., Disp: 180 tablet, Rfl: 0   Cholecalciferol (VITAMIN D) 50 MCG (2000 UT) CAPS, Take 1 capsule (2,000 Units total) by mouth daily., Disp: , Rfl:    Chromium 200 MCG CAPS, Take by mouth., Disp: , Rfl:    escitalopram (LEXAPRO) 5 MG tablet, Take 1 tablet (5 mg total) by mouth daily., Disp: 90 tablet, Rfl: 0   fenofibrate (TRICOR) 145 MG  tablet, Take 145 mg by mouth daily., Disp: , Rfl:    levothyroxine (SYNTHROID) 88 MCG tablet, Take 88 mcg by mouth daily before breakfast., Disp: , Rfl:    magnesium 30 MG tablet, Take 30 mg by mouth 2 (two) times daily., Disp: , Rfl:    Melatonin 5 MG CAPS, Take by mouth., Disp: , Rfl:    Methylsulfonylmethane (MSM) 1000 MG TABS, Take by mouth., Disp: , Rfl:    QUERCETIN PO, Take by mouth., Disp: , Rfl:    topiramate (TOPAMAX) 50 MG tablet, Take 1 tablet (50 mg total) by mouth daily., Disp: 90 tablet, Rfl: 0  Past Medical Problems: Past Medical History:  Diagnosis Date   Anxiety    Arthritis    Back pain    Bilateral swelling of feet    Constipation    Depression    Fatigue    GERD (gastroesophageal reflux disease)    Hyperglycemia    Hyperlipidemia    Hypothyroidism    Joint pain    Thyroid disease    Vitamin D deficiency     Past Surgical History: Past Surgical History:  Procedure Laterality Date   cataract surgery Left 2017   CHOLECYSTECTOMY     FOOT SURGERY Left 2016   rous-en-y  ROUX-EN-Y PROCEDURE      Social History: Social History   Socioeconomic History   Marital status: Married    Spouse name: Not on file   Number of children: Not on file   Years of education: Not on file   Highest education level: Not on file  Occupational History   Not on file  Tobacco Use   Smoking status: Former    Types: Cigarettes   Smokeless tobacco: Never  Vaping Use   Vaping status: Never Used  Substance and Sexual Activity   Alcohol use: Yes   Drug use: Not Currently   Sexual activity: Not on file  Other Topics Concern   Not on file  Social History Narrative   Not on file   Social Determinants of Health   Financial Resource Strain: Not on file  Food Insecurity: Not on file  Transportation Needs: Not on file  Physical Activity: Not on file  Stress: Not on file  Social Connections: Not on file  Intimate Partner Violence: Not on file    Family History: No  family history on file.  Review of Systems: ROS  Physical Exam: Vital Signs There were no vitals taken for this visit.  Physical Exam *** Constitutional:      General: Not in acute distress.    Appearance: Normal appearance. Not ill-appearing.  HENT:     Head: Normocephalic and atraumatic.  Eyes:     Pupils: Pupils are equal, round Neck:     Musculoskeletal: Normal range of motion.  Cardiovascular:     Rate and Rhythm: Normal rate    Pulses: Normal pulses.  Pulmonary:     Effort: Pulmonary effort is normal. No respiratory distress.  Musculoskeletal: Normal range of motion.  Skin:    General: Skin is warm and dry.     Findings: No erythema or rash.  Neurological:     General: No focal deficit present.     Mental Status: Alert and oriented to person, place, and time. Mental status is at baseline.     Motor: No weakness.  Psychiatric:        Mood and Affect: Mood normal.        Behavior: Behavior normal.    Assessment/Plan: The patient is scheduled for bilateral breast reduction with Dr. {[id number]:[id number]::"QVZDGL","OVFIEPPIRJ"}.  Risks, benefits, and alternatives of procedure discussed, questions answered and consent obtained.    Smoking Status: ***; Counseling Given? *** Last Mammogram: ***; Results: ***  Caprini Score: ***; Risk Factors include: ***, BMI *** 25, and length of planned surgery. Recommendation for mechanical *** pharmacological prophylaxis. Encourage early ambulation.   Pictures obtained: @consult ***  Post-op Rx sent to pharmacy: {Blank:[id number]::"Oxycodone, Zofran, Keflex","Oxycodone, Zofran"}  Patient was provided with the breast reduction and General Surgical Risk consent document and Pain Medication Agreement prior to their appointment.  They had adequate time to read through the risk consent documents and Pain Medication Agreement. We also discussed them in person together during this preop appointment. All of their questions were answered to their  satisfaction.  Recommended calling if they have any further questions.  Risk consent form and Pain Medication Agreement to be scanned into patient's chart.  The risk that can be encountered with breast reduction were discussed and include the following but not limited to these:  Breast asymmetry, fluid accumulation, firmness of the breast, inability to breast feed, loss of nipple or areola, skin loss, decrease or no nipple sensation, fat necrosis of the breast tissue, bleeding, infection, healing delay.  There are risks of anesthesia, changes to skin sensation and injury to nerves or blood vessels.  The muscle can be temporarily or permanently injured.  You may have an allergic reaction to tape, suture, glue, blood products which can result in skin discoloration, swelling, pain, skin lesions, poor healing.  Any of these can lead to the need for revisonal surgery or stage procedures.  A reduction has potential to interfere with diagnostic procedures.  Nipple or breast piercing can increase risks of infection.  This procedure is best done when the breast is fully developed.  Changes in the breast will continue to occur over time.  Pregnancy can alter the outcomes of previous breast reduction surgery, weight gain and weigh loss can also effect the long term appearance.     Electronically signed by: Kermit Balo Chelsei Mcchesney, PA-C 09/24/2022 11:03 AM

## 2022-09-24 NOTE — H&P (View-Only) (Signed)
 Patient ID: Holly Larsen, female    DOB: [birth year], 62 y.o.   MRN: [id number]  No chief complaint on file.     ICD-10-CM   1. Macromastia  N62     2. Back pain of thoracolumbar region  M54.50    M54.6     3. Postural kyphosis, thoracic region  M40.04        History of Present Illness: Holly Larsen is a 62 y.o.  female  with a history of macromastia.  She presents for preoperative evaluation for upcoming procedure, Bilateral Breast Reduction, scheduled for 10/08/2022 with Dr.  Ladona Ridgel  The patient {HAS HAS [id number]} had problems with anesthesia. ***  Summary of Previous Visit: Patient is higher risk for nipple loss due to the length of her breast.  She understands that conversion to free nipple graft may be necessary.  STN is 40 cm bilaterally and 20 cm fold the nipple on the right and 21 cm on the left.  Estimated excess breast tissue to be removed at time of surgery: 600 grams  Job: ***  PMH Significant for: Macromastia, Anxiety, GERD, HLD, Hypothyroidism, Vit D Deficiency.    Past Medical History: Allergies: Allergies  Allergen Reactions   Aleve [Naproxen]    Morphine And Codeine    Tylenol With Codeine #3 [Acetaminophen-Codeine]     Current Medications:  Current Outpatient Medications:    Acetylcysteine (NAC 600) 600 MG CAPS, Take by mouth., Disp: , Rfl:    ALPHA-LIPOIC ACID PO, Take 1,000 mg by mouth., Disp: , Rfl:    Ascorbic Acid (VITAMIN C) 1000 MG tablet, Take 1,000 mg by mouth daily. With bioflavonoid, Disp: , Rfl:    buPROPion (WELLBUTRIN SR) 150 MG 12 hr tablet, Take 1 tablet (150 mg total) by mouth 2 (two) times daily., Disp: 180 tablet, Rfl: 0   Cholecalciferol (VITAMIN D) 50 MCG (2000 UT) CAPS, Take 1 capsule (2,000 Units total) by mouth daily., Disp: , Rfl:    Chromium 200 MCG CAPS, Take by mouth., Disp: , Rfl:    escitalopram (LEXAPRO) 5 MG tablet, Take 1 tablet (5 mg total) by mouth daily., Disp: 90 tablet, Rfl: 0   fenofibrate (TRICOR) 145 MG  tablet, Take 145 mg by mouth daily., Disp: , Rfl:    levothyroxine (SYNTHROID) 88 MCG tablet, Take 88 mcg by mouth daily before breakfast., Disp: , Rfl:    magnesium 30 MG tablet, Take 30 mg by mouth 2 (two) times daily., Disp: , Rfl:    Melatonin 5 MG CAPS, Take by mouth., Disp: , Rfl:    Methylsulfonylmethane (MSM) 1000 MG TABS, Take by mouth., Disp: , Rfl:    QUERCETIN PO, Take by mouth., Disp: , Rfl:    topiramate (TOPAMAX) 50 MG tablet, Take 1 tablet (50 mg total) by mouth daily., Disp: 90 tablet, Rfl: 0  Past Medical Problems: Past Medical History:  Diagnosis Date   Anxiety    Arthritis    Back pain    Bilateral swelling of feet    Constipation    Depression    Fatigue    GERD (gastroesophageal reflux disease)    Hyperglycemia    Hyperlipidemia    Hypothyroidism    Joint pain    Thyroid disease    Vitamin D deficiency     Past Surgical History: Past Surgical History:  Procedure Laterality Date   cataract surgery Left 2017   CHOLECYSTECTOMY     FOOT SURGERY Left 2016   rous-en-y  ROUX-EN-Y PROCEDURE      Social History: Social History   Socioeconomic History   Marital status: Married    Spouse name: Not on file   Number of children: Not on file   Years of education: Not on file   Highest education level: Not on file  Occupational History   Not on file  Tobacco Use   Smoking status: Former    Types: Cigarettes   Smokeless tobacco: Never  Vaping Use   Vaping status: Never Used  Substance and Sexual Activity   Alcohol use: Yes   Drug use: Not Currently   Sexual activity: Not on file  Other Topics Concern   Not on file  Social History Narrative   Not on file   Social Determinants of Health   Financial Resource Strain: Not on file  Food Insecurity: Not on file  Transportation Needs: Not on file  Physical Activity: Not on file  Stress: Not on file  Social Connections: Not on file  Intimate Partner Violence: Not on file    Family History: No  family history on file.  Review of Systems: ROS  Physical Exam: Vital Signs There were no vitals taken for this visit.  Physical Exam *** Constitutional:      General: Not in acute distress.    Appearance: Normal appearance. Not ill-appearing.  HENT:     Head: Normocephalic and atraumatic.  Eyes:     Pupils: Pupils are equal, round Neck:     Musculoskeletal: Normal range of motion.  Cardiovascular:     Rate and Rhythm: Normal rate    Pulses: Normal pulses.  Pulmonary:     Effort: Pulmonary effort is normal. No respiratory distress.  Musculoskeletal: Normal range of motion.  Skin:    General: Skin is warm and dry.     Findings: No erythema or rash.  Neurological:     General: No focal deficit present.     Mental Status: Alert and oriented to person, place, and time. Mental status is at baseline.     Motor: No weakness.  Psychiatric:        Mood and Affect: Mood normal.        Behavior: Behavior normal.    Assessment/Plan: The patient is scheduled for bilateral breast reduction with Dr. {[id number]:[id number]::"QVZDGL","OVFIEPPIRJ"}.  Risks, benefits, and alternatives of procedure discussed, questions answered and consent obtained.    Smoking Status: ***; Counseling Given? *** Last Mammogram: ***; Results: ***  Caprini Score: ***; Risk Factors include: ***, BMI *** 25, and length of planned surgery. Recommendation for mechanical *** pharmacological prophylaxis. Encourage early ambulation.   Pictures obtained: @consult ***  Post-op Rx sent to pharmacy: {Blank:[id number]::"Oxycodone, Zofran, Keflex","Oxycodone, Zofran"}  Patient was provided with the breast reduction and General Surgical Risk consent document and Pain Medication Agreement prior to their appointment.  They had adequate time to read through the risk consent documents and Pain Medication Agreement. We also discussed them in person together during this preop appointment. All of their questions were answered to their  satisfaction.  Recommended calling if they have any further questions.  Risk consent form and Pain Medication Agreement to be scanned into patient's chart.  The risk that can be encountered with breast reduction were discussed and include the following but not limited to these:  Breast asymmetry, fluid accumulation, firmness of the breast, inability to breast feed, loss of nipple or areola, skin loss, decrease or no nipple sensation, fat necrosis of the breast tissue, bleeding, infection, healing delay.  There are risks of anesthesia, changes to skin sensation and injury to nerves or blood vessels.  The muscle can be temporarily or permanently injured.  You may have an allergic reaction to tape, suture, glue, blood products which can result in skin discoloration, swelling, pain, skin lesions, poor healing.  Any of these can lead to the need for revisonal surgery or stage procedures.  A reduction has potential to interfere with diagnostic procedures.  Nipple or breast piercing can increase risks of infection.  This procedure is best done when the breast is fully developed.  Changes in the breast will continue to occur over time.  Pregnancy can alter the outcomes of previous breast reduction surgery, weight gain and weigh loss can also effect the long term appearance.     Electronically signed by: Kermit Balo Chelsei Mcchesney, PA-C 09/24/2022 11:03 AM

## 2022-09-25 ENCOUNTER — Encounter: Payer: Self-pay | Admitting: Surgical

## 2022-09-25 ENCOUNTER — Ambulatory Visit (INDEPENDENT_AMBULATORY_CARE_PROVIDER_SITE_OTHER): Payer: BC Managed Care – PPO | Admitting: Surgical

## 2022-09-25 VITALS — BP 152/91 | HR 90 | Ht 60.0 in | Wt 186.6 lb

## 2022-09-25 DIAGNOSIS — M4004 Postural kyphosis, thoracic region: Secondary | ICD-10-CM

## 2022-09-25 DIAGNOSIS — M545 Low back pain, unspecified: Secondary | ICD-10-CM

## 2022-09-25 DIAGNOSIS — N62 Hypertrophy of breast: Secondary | ICD-10-CM

## 2022-09-25 DIAGNOSIS — M546 Pain in thoracic spine: Secondary | ICD-10-CM

## 2022-09-25 MED ORDER — ONDANSETRON HCL 4 MG PO TABS: 4.0000 mg | ORAL_TABLET | Freq: Three times a day (TID) | ORAL | 0 refills | Status: DC | PRN

## 2022-09-25 MED ORDER — OXYCODONE HCL 5 MG PO TABS: 5.0000 mg | ORAL_TABLET | Freq: Four times a day (QID) | ORAL | 0 refills | Status: AC | PRN

## 2022-09-30 ENCOUNTER — Encounter (HOSPITAL_BASED_OUTPATIENT_CLINIC_OR_DEPARTMENT_OTHER): Payer: Self-pay | Admitting: Plastic Surgery

## 2022-09-30 ENCOUNTER — Other Ambulatory Visit: Payer: Self-pay

## 2022-10-03 ENCOUNTER — Ambulatory Visit (INDEPENDENT_AMBULATORY_CARE_PROVIDER_SITE_OTHER): Payer: BC Managed Care – PPO | Admitting: Behavioral Health

## 2022-10-03 DIAGNOSIS — F411 Generalized anxiety disorder: Secondary | ICD-10-CM | POA: Diagnosis not present

## 2022-10-03 DIAGNOSIS — F330 Major depressive disorder, recurrent, mild: Secondary | ICD-10-CM

## 2022-10-03 NOTE — Progress Notes (Signed)
[facility] Counselor/Therapist Progress Note  Patient ID: Holly Larsen, MRN: [id number],    Date: 10/03/2022  Time Spent: 55 min Caregility video; Pt is in private & Provider is working remotely from [employer]. Pt is aware of the risks/limitations of telehealth & consents to Tx today. Time In: 1:00pm Time Out: 1:55pm  Treatment Type: Individual Therapy  Reported Symptoms: Elevated anx/dep & stress due to Family issues involving her older Str who is rigid & even "fanatic" about her mental health concerns. Medicine is not highly valued & the religiosity of this has stymied her progress.   Mental Status Exam: Appearance:  Casual     Behavior: Appropriate and Sharing  Motor: Normal  Speech/Language:  Clear and Coherent  Affect: Appropriate  Mood: normal  Thought process: normal  Thought content:   WNL  Sensory/Perceptual disturbances:   WNL  Orientation: oriented to person, place, time/date, and situation  Attention: Good  Concentration: Good  Memory: WNL  Fund of knowledge:  Good  Insight:   Good  Judgment:  Good  Impulse Control: Good   Risk Assessment: Danger to Self:  No Self-injurious Behavior: No Danger to Others: No Duty to Warn:no Physical Aggression / Violence:No  Access to Firearms a concern: No  Gang Involvement:No   Subjective: Pt is upbeat today & seeing her Family in new & revised ways. She is trying to find her way w/her health in positive ways. Her upcoming surgery prep is going well. She wants her Str Cathy to stay away & not influence her anymore.   Pt has expressed her delayed anger rxn to her friend Kim's exp w/Pt. She realizes this friend has tried to control her & this has inhibited her personal growth.   Interventions: Solution-Oriented/Positive Psychology, Spiritual support  Diagnosis:Generalized anxiety disorder  Major depressive disorder, recurrent episode, mild (HCC)  Plan: Dania will express these experiences in her  Notebook btwn sessions so she can facilitate processing btwn visits. Sit down & intentionally record these experiences so you can envelope these thoughts into your experience in a healthy way.   Target Date: 11/04/2022  Progress: 4  Frequency: Once every 2-3 wks   Modality: Shanicka Kaltz has become able to see her Family in ways that are realistic. She will cont to keep her eyes wide open & use skepticism to help her grow.   Target Date: 11/04/2022  Progress: 5  Frequency: Once every 2-3 wks  Modality: [name], [name]

## 2022-10-03 NOTE — Progress Notes (Signed)
                Victoria L Winstead, [name] 

## 2022-10-07 ENCOUNTER — Encounter (HOSPITAL_BASED_OUTPATIENT_CLINIC_OR_DEPARTMENT_OTHER): Payer: Self-pay | Admitting: Plastic Surgery

## 2022-10-07 NOTE — Anesthesia Preprocedure Evaluation (Signed)
Anesthesia Evaluation  Patient identified by MRN, date of birth, ID band Patient awake    Reviewed: Allergy & Precautions, NPO status , Patient's Chart, lab work & pertinent test results  Airway Mallampati: II  TM Distance: >3 FB     Dental  (+) Teeth Intact, Dental Advisory Given   Pulmonary former smoker   Pulmonary exam normal breath sounds clear to auscultation       Cardiovascular negative cardio ROS Normal cardiovascular exam Rhythm:Regular Rate:Normal  EKG 08/27/22 NSR, Normal   Neuro/Psych  PSYCHIATRIC DISORDERS Anxiety Depression    negative neurological ROS     GI/Hepatic Neg liver ROS,GERD  Medicated,,  Endo/Other  Hypothyroidism  Obesity Pre diabetes- Hb A1c- 6.1 Hyperlipidemia Macromastia  Renal/GU negative Renal ROS  negative genitourinary   Musculoskeletal  (+) Arthritis , Osteoarthritis,  Back pain   Abdominal  (+) + obese  Peds  Hematology negative hematology ROS (+)   Anesthesia Other Findings   Reproductive/Obstetrics                              Anesthesia Physical Anesthesia Plan  ASA: 2  Anesthesia Plan: General   Post-op Pain Management: Tylenol PO (pre-op)*, Dilaudid IV and Precedex   Induction: Intravenous  PONV Risk Score and Plan: 4 or greater and Treatment may vary due to age or medical condition, Midazolam, Ondansetron and Dexamethasone  Airway Management Planned: Oral ETT  Additional Equipment: None  Intra-op Plan:   Post-operative Plan: Extubation in OR  Informed Consent: I have reviewed the patients History and Physical, chart, labs and discussed the procedure including the risks, benefits and alternatives for the proposed anesthesia with the patient or authorized representative who has indicated his/her understanding and acceptance.     Dental advisory given  Plan Discussed with: Anesthesiologist and CRNA  Anesthesia Plan Comments:           Anesthesia Quick Evaluation

## 2022-10-08 ENCOUNTER — Encounter (HOSPITAL_BASED_OUTPATIENT_CLINIC_OR_DEPARTMENT_OTHER)
Admission: RE | Disposition: A | Discharge status: Home / Self Care | Payer: Self-pay | Source: Home / Self Care | Attending: Plastic Surgery

## 2022-10-08 ENCOUNTER — Ambulatory Visit (HOSPITAL_BASED_OUTPATIENT_CLINIC_OR_DEPARTMENT_OTHER): Payer: Self-pay | Admitting: Certified Registered"

## 2022-10-08 ENCOUNTER — Ambulatory Visit (HOSPITAL_BASED_OUTPATIENT_CLINIC_OR_DEPARTMENT_OTHER)
Admission: RE | Admit: 2022-10-08 | Discharge: 2022-10-08 | Disposition: A | Discharge status: Home / Self Care | Payer: BC Managed Care – PPO | Attending: Plastic Surgery | Admitting: Plastic Surgery

## 2022-10-08 ENCOUNTER — Other Ambulatory Visit: Payer: Self-pay

## 2022-10-08 ENCOUNTER — Encounter (HOSPITAL_BASED_OUTPATIENT_CLINIC_OR_DEPARTMENT_OTHER): Payer: Self-pay | Admitting: Plastic Surgery

## 2022-10-08 ENCOUNTER — Ambulatory Visit (HOSPITAL_BASED_OUTPATIENT_CLINIC_OR_DEPARTMENT_OTHER): Payer: BC Managed Care – PPO | Admitting: Certified Registered"

## 2022-10-08 DIAGNOSIS — E039 Hypothyroidism, unspecified: Secondary | ICD-10-CM | POA: Diagnosis not present

## 2022-10-08 DIAGNOSIS — E669 Obesity, unspecified: Secondary | ICD-10-CM | POA: Diagnosis not present

## 2022-10-08 DIAGNOSIS — E785 Hyperlipidemia, unspecified: Secondary | ICD-10-CM | POA: Insufficient documentation

## 2022-10-08 DIAGNOSIS — K219 Gastro-esophageal reflux disease without esophagitis: Secondary | ICD-10-CM | POA: Insufficient documentation

## 2022-10-08 DIAGNOSIS — F419 Anxiety disorder, unspecified: Secondary | ICD-10-CM | POA: Diagnosis not present

## 2022-10-08 DIAGNOSIS — F32A Depression, unspecified: Secondary | ICD-10-CM | POA: Insufficient documentation

## 2022-10-08 DIAGNOSIS — N62 Hypertrophy of breast: Secondary | ICD-10-CM | POA: Insufficient documentation

## 2022-10-08 DIAGNOSIS — Z87891 Personal history of nicotine dependence: Secondary | ICD-10-CM | POA: Insufficient documentation

## 2022-10-08 DIAGNOSIS — R7303 Prediabetes: Secondary | ICD-10-CM | POA: Diagnosis not present

## 2022-10-08 DIAGNOSIS — Z6836 Body mass index (BMI) 36.0-36.9, adult: Secondary | ICD-10-CM | POA: Insufficient documentation

## 2022-10-08 HISTORY — PX: BREAST REDUCTION SURGERY: SHX8

## 2022-10-08 SURGERY — MAMMOPLASTY, REDUCTION
Anesthesia: General | Site: Breast | Laterality: Bilateral

## 2022-10-08 MED ORDER — SUGAMMADEX SODIUM 200 MG/2ML IV SOLN
INTRAVENOUS | Status: DC | PRN
  Administered 2022-10-08: 180 mg via INTRAVENOUS

## 2022-10-08 MED ORDER — SODIUM CHLORIDE (PF) 0.9 % IJ SOLN
INTRAMUSCULAR | Status: DC | PRN
Start: 2022-10-08 — End: 2022-10-08
  Administered 2022-10-08: 20 mL

## 2022-10-08 MED ORDER — CHLORHEXIDINE GLUCONATE CLOTH 2 % EX PADS: 6.0000 | MEDICATED_PAD | Freq: Once | CUTANEOUS | Status: DC

## 2022-10-08 MED ORDER — FENTANYL CITRATE (PF) 100 MCG/2ML IJ SOLN
INTRAMUSCULAR | Status: AC
  Filled 2022-10-08: qty 2

## 2022-10-08 MED ORDER — 0.9 % SODIUM CHLORIDE (POUR BTL) OPTIME
TOPICAL | Status: DC | PRN
  Administered 2022-10-08 (×3): 1000 mL

## 2022-10-08 MED ORDER — BUPIVACAINE HCL (PF) 0.25 % IJ SOLN
INTRAMUSCULAR | Status: AC
  Filled 2022-10-08: qty 30

## 2022-10-08 MED ORDER — DEXAMETHASONE SODIUM PHOSPHATE 10 MG/ML IJ SOLN
INTRAMUSCULAR | Status: DC | PRN
  Administered 2022-10-08: 10 mg via INTRAVENOUS

## 2022-10-08 MED ORDER — BUPIVACAINE LIPOSOME 1.3 % IJ SUSP
INTRAMUSCULAR | Status: AC
  Filled 2022-10-08: qty 20

## 2022-10-08 MED ORDER — HYDROMORPHONE HCL 1 MG/ML IJ SOLN
INTRAMUSCULAR | Status: DC | PRN
  Administered 2022-10-08: .5 mg via INTRAVENOUS

## 2022-10-08 MED ORDER — LACTATED RINGERS IV SOLN: INTRAVENOUS | Status: DC

## 2022-10-08 MED ORDER — ROCURONIUM BROMIDE 100 MG/10ML IV SOLN
INTRAVENOUS | Status: DC | PRN
  Administered 2022-10-08: 50 mg via INTRAVENOUS

## 2022-10-08 MED ORDER — TRANEXAMIC ACID 1000 MG/10ML IV SOLN
Status: DC | PRN
  Administered 2022-10-08: 3000 mg via TOPICAL

## 2022-10-08 MED ORDER — DEXAMETHASONE SODIUM PHOSPHATE 10 MG/ML IJ SOLN
INTRAMUSCULAR | Status: AC
  Filled 2022-10-08: qty 2

## 2022-10-08 MED ORDER — ONDANSETRON HCL 4 MG/2ML IJ SOLN
INTRAMUSCULAR | Status: DC | PRN
  Administered 2022-10-08: 4 mg via INTRAVENOUS

## 2022-10-08 MED ORDER — ACETAMINOPHEN 500 MG PO TABS
ORAL_TABLET | ORAL | Status: AC
  Filled 2022-10-08: qty 2

## 2022-10-08 MED ORDER — OXYCODONE HCL 5 MG PO TABS: 5.0000 mg | ORAL_TABLET | Freq: Once | ORAL | Status: DC | PRN

## 2022-10-08 MED ORDER — BUPIVACAINE-EPINEPHRINE (PF) 0.25% -1:200000 IJ SOLN
INTRAMUSCULAR | Status: AC
  Filled 2022-10-08: qty 30

## 2022-10-08 MED ORDER — NITROGLYCERIN 2 % TD OINT
TOPICAL_OINTMENT | TRANSDERMAL | Status: DC | PRN
  Administered 2022-10-08: .5 [in_us] via TOPICAL

## 2022-10-08 MED ORDER — MIDAZOLAM HCL 2 MG/2ML IJ SOLN
INTRAMUSCULAR | Status: AC
  Filled 2022-10-08: qty 2

## 2022-10-08 MED ORDER — ATROPINE SULFATE 0.4 MG/ML IV SOLN
INTRAVENOUS | Status: AC
  Filled 2022-10-08: qty 1

## 2022-10-08 MED ORDER — ACETAMINOPHEN 325 MG PO TABS: 650.0000 mg | ORAL_TABLET | Freq: Once | ORAL | Status: DC

## 2022-10-08 MED ORDER — HYDROMORPHONE HCL 1 MG/ML IJ SOLN
INTRAMUSCULAR | Status: AC
  Filled 2022-10-08: qty 0.5

## 2022-10-08 MED ORDER — MIDAZOLAM HCL 5 MG/5ML IJ SOLN
INTRAMUSCULAR | Status: DC | PRN
  Administered 2022-10-08: 2 mg via INTRAVENOUS

## 2022-10-08 MED ORDER — PROPOFOL 10 MG/ML IV BOLUS
INTRAVENOUS | Status: DC | PRN
  Administered 2022-10-08: 150 mg via INTRAVENOUS

## 2022-10-08 MED ORDER — TRANEXAMIC ACID 1000 MG/10ML IV SOLN
INTRAVENOUS | Status: AC
  Filled 2022-10-08: qty 30

## 2022-10-08 MED ORDER — PROPOFOL 10 MG/ML IV BOLUS
INTRAVENOUS | Status: AC
  Filled 2022-10-08: qty 20

## 2022-10-08 MED ORDER — LIDOCAINE 2% (20 MG/ML) 5 ML SYRINGE
INTRAMUSCULAR | Status: DC | PRN
  Administered 2022-10-08: 80 mg via INTRAVENOUS

## 2022-10-08 MED ORDER — CEFAZOLIN SODIUM-DEXTROSE 2-4 GM/100ML-% IV SOLN
INTRAVENOUS | Status: AC
  Filled 2022-10-08: qty 100

## 2022-10-08 MED ORDER — SODIUM CHLORIDE (PF) 0.9 % IJ SOLN
INTRAMUSCULAR | Status: AC
  Filled 2022-10-08: qty 20

## 2022-10-08 MED ORDER — DROPERIDOL 2.5 MG/ML IJ SOLN: 0.6250 mg | Freq: Once | INTRAMUSCULAR | Status: DC | PRN

## 2022-10-08 MED ORDER — NITROGLYCERIN 2 % TD OINT
TOPICAL_OINTMENT | TRANSDERMAL | Status: AC
  Filled 2022-10-08: qty 30

## 2022-10-08 MED ORDER — FENTANYL CITRATE (PF) 100 MCG/2ML IJ SOLN
INTRAMUSCULAR | Status: DC | PRN
  Administered 2022-10-08 (×4): 50 ug via INTRAVENOUS

## 2022-10-08 MED ORDER — HYDROMORPHONE HCL 1 MG/ML IJ SOLN: 0.2500 mg | INTRAMUSCULAR | Status: DC | PRN

## 2022-10-08 MED ORDER — SODIUM CHLORIDE 0.9 % IV SOLN
INTRAVENOUS | Status: DC | PRN
  Administered 2022-10-08: 40 mL

## 2022-10-08 MED ORDER — CEFAZOLIN SODIUM-DEXTROSE 2-4 GM/100ML-% IV SOLN
2.0000 g | INTRAVENOUS | Status: AC
  Administered 2022-10-08: 2 g via INTRAVENOUS

## 2022-10-08 MED ORDER — ONDANSETRON HCL 4 MG/2ML IJ SOLN
INTRAMUSCULAR | Status: AC
  Filled 2022-10-08: qty 2

## 2022-10-08 MED ORDER — LIDOCAINE 2% (20 MG/ML) 5 ML SYRINGE
INTRAMUSCULAR | Status: AC
  Filled 2022-10-08: qty 5

## 2022-10-08 MED ORDER — ONDANSETRON HCL 4 MG/2ML IJ SOLN: 4.0000 mg | Freq: Once | INTRAMUSCULAR | Status: DC | PRN

## 2022-10-08 MED ORDER — OXYCODONE HCL 5 MG/5ML PO SOLN: 5.0000 mg | Freq: Once | ORAL | Status: DC | PRN

## 2022-10-08 SURGICAL SUPPLY — 62 items
ADH SKN CLS APL DERMABOND .7 (GAUZE/BANDAGES/DRESSINGS) ×4
BINDER BREAST 3XL (GAUZE/BANDAGES/DRESSINGS) IMPLANT
BINDER BREAST LRG (GAUZE/BANDAGES/DRESSINGS) IMPLANT
BINDER BREAST MEDIUM (GAUZE/BANDAGES/DRESSINGS) IMPLANT
BINDER BREAST XLRG (GAUZE/BANDAGES/DRESSINGS) IMPLANT
BINDER BREAST XXLRG (GAUZE/BANDAGES/DRESSINGS) IMPLANT
BIOPATCH RED 1 DISK 7.0 (GAUZE/BANDAGES/DRESSINGS) ×2 IMPLANT
BLADE HEX COATED 2.75 (ELECTRODE) IMPLANT
BLADE SURG 10 STRL SS (BLADE) ×4 IMPLANT
BLADE SURG 15 STRL LF DISP TIS (BLADE) ×1 IMPLANT
BLADE SURG 15 STRL SS (BLADE) ×1
CANISTER SUCT 1200ML W/VALVE (MISCELLANEOUS) ×1 IMPLANT
DERMABOND ADVANCED .7 DNX12 (GAUZE/BANDAGES/DRESSINGS) ×2 IMPLANT
DRAIN CHANNEL 19F RND (DRAIN) ×2 IMPLANT
DRAIN RELI 100 BL SUC LF ST (DRAIN) ×2
DRAPE UTILITY XL STRL (DRAPES) ×1 IMPLANT
DRESSING MEPILEX FLEX 4X4 (GAUZE/BANDAGES/DRESSINGS) IMPLANT
DRSG MEPILEX FLEX 4X4 (GAUZE/BANDAGES/DRESSINGS) ×1
DRSG TEGADERM 4X4.75 (GAUZE/BANDAGES/DRESSINGS) ×2 IMPLANT
ELECT BLADE 4.0 EZ CLEAN MEGAD (MISCELLANEOUS) ×1
ELECT REM PT RETURN 9FT ADLT (ELECTROSURGICAL) ×2
ELECTRODE BLDE 4.0 EZ CLN MEGD (MISCELLANEOUS) ×1 IMPLANT
ELECTRODE REM PT RTRN 9FT ADLT (ELECTROSURGICAL) ×2 IMPLANT
EVACUATOR SILICONE 100CC (DRAIN) ×2 IMPLANT
GAUZE PAD ABD 8X10 STRL (GAUZE/BANDAGES/DRESSINGS) ×2 IMPLANT
GAUZE SPONGE 2X2 STRL 8-PLY (GAUZE/BANDAGES/DRESSINGS) ×2 IMPLANT
GAUZE XEROFORM 1X8 LF (GAUZE/BANDAGES/DRESSINGS) IMPLANT
GLOVE BIO SURGEON STRL SZ 6.5 (GLOVE) IMPLANT
GLOVE BIO SURGEON STRL SZ7.5 (GLOVE) ×1 IMPLANT
GLOVE BIO SURGEON STRL SZ8 (GLOVE) ×1 IMPLANT
GLOVE BIOGEL PI IND STRL 7.0 (GLOVE) IMPLANT
GLOVE BIOGEL PI IND STRL 8 (GLOVE) ×1 IMPLANT
GOWN STRL REUS W/ TWL LRG LVL3 (GOWN DISPOSABLE) IMPLANT
GOWN STRL REUS W/TWL LRG LVL3 (GOWN DISPOSABLE)
GOWN STRL REUS W/TWL XL LVL3 (GOWN DISPOSABLE) ×1 IMPLANT
HEMOSTAT ARISTA ABSORB 3G PWDR (HEMOSTASIS) IMPLANT
HIBICLENS CHG 4% 4OZ BTL (MISCELLANEOUS) ×1 IMPLANT
MARKER SKIN DUAL TIP RULER LAB (MISCELLANEOUS) ×1 IMPLANT
NDL HYPO 25X1 1.5 SAFETY (NEEDLE) IMPLANT
NEEDLE HYPO 25X1 1.5 SAFETY (NEEDLE) ×1
NS IRRIG 1000ML POUR BTL (IV SOLUTION) ×1 IMPLANT
PACK BASIN DAY SURGERY FS (CUSTOM PROCEDURE TRAY) ×1 IMPLANT
PACK UNIVERSAL I (CUSTOM PROCEDURE TRAY) ×1 IMPLANT
PENCIL SMOKE EVACUATOR (MISCELLANEOUS) ×2 IMPLANT
PIN SAFETY STERILE (MISCELLANEOUS) IMPLANT
SLEEVE SCD COMPRESS KNEE MED (STOCKING) ×1 IMPLANT
SPONGE T-LAP 18X18 ~~LOC~~+RFID (SPONGE) ×3 IMPLANT
STAPLER VISISTAT 35W (STAPLE) ×1 IMPLANT
SUT MNCRL AB 3-0 PS2 27 (SUTURE) ×4 IMPLANT
SUT MNCRL AB 4-0 PS2 18 (SUTURE) ×4 IMPLANT
SUT MON AB 2-0 CT1 36 (SUTURE) ×1 IMPLANT
SUT MON AB 5-0 PS2 18 (SUTURE) IMPLANT
SUT PROLENE 3 0 PS 2 (SUTURE) IMPLANT
SUT SILK 2 0 SH (SUTURE) ×2 IMPLANT
SUT VIC AB 3-0 SH 27 (SUTURE)
SUT VIC AB 3-0 SH 27X BRD (SUTURE) IMPLANT
SYR BULB IRRIG 60ML STRL (SYRINGE) ×1 IMPLANT
TOWEL GREEN STERILE FF (TOWEL DISPOSABLE) ×2 IMPLANT
TRAY DSU PREP LF (CUSTOM PROCEDURE TRAY) ×1 IMPLANT
TUBE CONNECTING 20X1/4 (TUBING) ×1 IMPLANT
UNDERPAD 30X36 HEAVY ABSORB (UNDERPADS AND DIAPERS) ×2 IMPLANT
YANKAUER SUCT BULB TIP NO VENT (SUCTIONS) ×1 IMPLANT

## 2022-10-08 NOTE — Op Note (Signed)
DATE OF OPERATION: 10/08/2022  LOCATION: [facility] operating Room  PREOPERATIVE DIAGNOSIS: Symptomatic macromastia  POSTOPERATIVE DIAGNOSIS: Same  PROCEDURE: Bilateral breast reduction  SURGEON: Loren Racer, MD  ASSISTANT: Evelena Leyden, primary, Matt Scheeler  EBL: 200 cc  CONDITION: Stable  COMPLICATIONS: None  INDICATION: The patient, Holly Larsen, is a 62 y.o. female born on 26-Mar-1960, is here for treatment of upper back and neck pain secondary to large size of breasts.   PROCEDURE DETAILS:  The patient was seen prior to surgery and marked.  The IV antibiotics were given. The patient was taken to the operating room and given a general anesthetic. A standard time out was performed and all information was confirmed by those in the room. SCDs were placed.   The chest was prepped and draped in usual sterile manner.  A 42 mm cookie cutter was used to outline the nipple and areolar complex.  A 10 cm based pedicle was outlined on the breast.  The right breast was addressed first.  A laparotomy tape was placed at the base of the breast as a tourniquet.  The pedicle was de-epithelialized and the electrocautery used to dissected the borders of the pedicle down to the chest wall.  The electrocautery was then used to resect the medial lateral and superior triangles of breast tissue and to develop and thin the superior skin flap.  This constituted the bulk of the reduction.  The total volume removed on the right side was 1156 g.  The surgical site was inspected for bleeding and hemostasis achieved with the electrocautery.  The surgical site was irrigated with warm normal saline.  A 19 French round drain was placed behind the pedicle and brought out through a separate stab incision.  The T point was brought together with a single 2-0 Monocryl suture.  Skin edges were tailor tacked in place and the dermis was closed with interrupted and running 3-0 Monocryl sutures.  The skin was closed with a  running 4-0 Monocryl subcuticular stitch.  Attention was turned to the left breast where similar procedure was performed.  After placing a tourniquet at the base of the breast the pedicle was de-epithelialized and dissected down to the chest wall.  Medial lateral and superior triangles of breast tissue were resected and the superior skin flap was developed and thinned.  The tissue removed constituted the bulk of the reduction.  The weight was 1386 g.  All breast tissue was sent to pathology for routine examination.  The surgical wounds were inspected for bleeding and hemostasis achieved with the electrocautery.  The surgical site was irrigated with warm normal saline.  A 19 French round drain was placed behind the pedicle and brought out through a separate stab incision.  The skin edges were tailor tacked in place and the dermis closed with interrupted and running 3-0 Monocryl sutures.  After closing the dermis the nipple on the left side appeared somewhat congested I released all the sutures and inspected the pedicle.  There is no hematoma surrounding the pedicle.  The pedicle had obvious bleeding when penetrated with a suture needle.  I reposition the pedicle and the congestion was markedly improved.  I elected not to proceed with a free nipple transfer.  The dermis was reclosed and a pursestring suture placed in the opening for the nipple areolar complex to remove all tension from the closure.  The closure was accomplished with interrupted 3-0 Monocryl sutures in the dermis with no subcuticular sutures.  1/4 inch  of Nitropaste was applied to the nipple to encourage vasoconstriction and blood supply to the nipple.  No other complications were noted.  All instrument needle and sponge counts were reported as correct.  The patient was awakened from anesthesia without incident and transferred to the recovery room in good condition.  I did discuss my concerns regarding the nipple with the patient's husband.  He  understands we will reevaluate the nipple tomorrow morning when the patient comes to the office to have her drains removed. The patient was allowed to wake up and taken to recovery room in stable condition at the end of the case. The family was notified at the end of the case.   The advanced practice practitioner (APP) assisted throughout the case.  The APP was essential in retraction and counter traction when needed to make the case progress smoothly.  This retraction and assistance made it possible to see the tissue plans for the procedure.  The assistance was needed for blood control, tissue re-approximation and assisted with closure of the incision site.

## 2022-10-08 NOTE — Anesthesia Postprocedure Evaluation (Signed)
Anesthesia Post Note  Patient: Holly Larsen  Procedure(s) Performed: MAMMARY REDUCTION  (BREAST) (Bilateral: Breast)     Patient location during evaluation: PACU Anesthesia Type: General Level of consciousness: awake and alert and oriented Pain management: pain level controlled Vital Signs Assessment: post-procedure vital signs reviewed and stable Respiratory status: spontaneous breathing, nonlabored ventilation and respiratory function stable Cardiovascular status: blood pressure returned to baseline and stable Postop Assessment: no apparent nausea or vomiting Anesthetic complications: no   No notable events documented.  Last Vitals:  Vitals:   10/08/22 1215 10/08/22 1230  BP: 135/88 137/80  Pulse: 90   Resp: 10   Temp:    SpO2: 96%     Last Pain:  Vitals:   10/08/22 1230  TempSrc:   PainSc: 4                  Mataya Kilduff A.

## 2022-10-08 NOTE — Transfer of Care (Signed)
Immediate Anesthesia Transfer of Care Note  Patient: Holly Larsen  Procedure(s) Performed: MAMMARY REDUCTION  (BREAST) (Bilateral: Breast)  Patient Location: PACU  Anesthesia Type:General  Level of Consciousness: drowsy  Airway & Oxygen Therapy: Patient Spontanous Breathing and Patient connected to face mask oxygen  Post-op Assessment: Report given to RN and Post -op Vital signs reviewed and stable  Post vital signs: Reviewed and stable  Last Vitals:  Vitals Value Taken Time  BP 145/90 10/08/22 1130  Temp    Pulse 93 10/08/22 1133  Resp 13 10/08/22 1133  SpO2 98 % 10/08/22 1133  Vitals shown include unfiled device data.  Last Pain:  Vitals:   10/08/22 0714  TempSrc: Oral  PainSc: 0-No pain         Complications: No notable events documented.

## 2022-10-08 NOTE — Discharge Instructions (Addendum)
Activity: Avoid strenuous activity.  No lifting, pushing, or pulling greater than 15 pounds.  Diet: No restrictions.  Try to optimize nutrition with plenty of proteins, fruits, and vegetables to improve healing.   Wound Care: Leave breast binder on for the first week and then you may transition to a front-clipping or front-zipping compression bra.  Sponge bathe only until our visit tomorrow, then we can discuss transition to showering with the emphasis on keeping the surgical site dry.  Replace the ABD pads over incisions with gauze or Maxi pads as needed for incisional drainage.   If you have drains placed, be sure to record the daily output from each side. They will be removed at your appointment tomorrow. Please make sure that the bulbs are charged.  If you experience any issues, please call the office.    Follow-Up: Scheduled for tomorrow.  Please gently lift the brown bandage over your left nipple this evening along with the underlying yellow gauze. Place 1/2" of the nitroglycerine ointment provided. Then place back down the yellow gauze and brown bandage over top.  You will not need to repeat this step again prior to your appointment tomorrow.  Things to watch for:  Call the office if you experience fever, chills, intractable vomiting, or significant bleeding.  Mild wound drainage is common after breast reduction surgery and should not be cause for alarm.   Post Anesthesia Home Care Instructions  Activity: Get plenty of rest for the remainder of the day. A responsible individual must stay with you for 24 hours following the procedure.  For the next 24 hours, DO NOT: -Drive a car -Advertising copywriter -Drink alcoholic beverages -Take any medication unless instructed by your physician -Make any legal decisions or sign important papers.  Meals: Start with liquid foods such as gelatin or soup. Progress to regular foods as tolerated. Avoid greasy, spicy, heavy foods. If nausea and/or  vomiting occur, drink only clear liquids until the nausea and/or vomiting subsides. Call your physician if vomiting continues.  Special Instructions/Symptoms: Your throat may feel dry or sore from the anesthesia or the breathing tube placed in your throat during surgery. If this causes discomfort, gargle with warm salt water. The discomfort should disappear within 24 hours.  If you had a scopolamine patch placed behind your ear for the management of post- operative nausea and/or vomiting:  1. The medication in the patch is effective for 72 hours, after which it should be removed.  Wrap patch in a tissue and discard in the trash. Wash hands thoroughly with soap and water. 2. You may remove the patch earlier than 72 hours if you experience unpleasant side effects which may include dry mouth, dizziness or visual disturbances. 3. Avoid touching the patch. Wash your hands with soap and water after contact with the patch.

## 2022-10-08 NOTE — Anesthesia Procedure Notes (Signed)
Procedure Name: Intubation Date/Time: 10/08/2022 7:39 AM  Performed by: Lauralyn Primes, CRNAPre-anesthesia Checklist: Patient identified, Emergency Drugs available, Suction available and Patient being monitored Patient Re-evaluated:Patient Re-evaluated prior to induction Oxygen Delivery Method: Circle system utilized Preoxygenation: Pre-oxygenation with 100% oxygen Induction Type: IV induction Ventilation: Mask ventilation without difficulty and Oral airway inserted - appropriate to patient size Laryngoscope Size: Mac and 3 Grade View: Grade I Tube type: Oral Tube size: 7.0 mm Number of attempts: 1 Airway Equipment and Method: Stylet, Oral airway and Bite block Placement Confirmation: ETT inserted through vocal cords under direct vision, positive ETCO2 and breath sounds checked- equal and bilateral Secured at: 21 cm Tube secured with: Tape Dental Injury: Teeth and Oropharynx as per pre-operative assessment

## 2022-10-08 NOTE — Interval H&P Note (Signed)
History and Physical Interval Note: Pt met in pre op area,no change in exam or indication for surgery. Marked for a breast reduction with her assistance. All questions answered. Will proceed with a bilateral breast reduction at her request.  10/08/2022 7:15 AM  Holly Larsen  has presented today for surgery, with the diagnosis of macromastia.  The various methods of treatment have been discussed with the patient and family. After consideration of risks, benefits and other options for treatment, the patient has consented to  Procedure(s): MAMMARY REDUCTION  (BREAST) (Bilateral) as a surgical intervention.  The patient's history has been reviewed, patient examined, no change in status, stable for surgery.  I have reviewed the patient's chart and labs.  Questions were answered to the patient's satisfaction.     [name]

## 2022-10-09 ENCOUNTER — Encounter (HOSPITAL_BASED_OUTPATIENT_CLINIC_OR_DEPARTMENT_OTHER): Payer: Self-pay | Admitting: Plastic Surgery

## 2022-10-09 ENCOUNTER — Ambulatory Visit (INDEPENDENT_AMBULATORY_CARE_PROVIDER_SITE_OTHER): Payer: BC Managed Care – PPO | Admitting: Plastic Surgery

## 2022-10-09 VITALS — BP 105/68 | HR 109

## 2022-10-09 DIAGNOSIS — Z9889 Other specified postprocedural states: Secondary | ICD-10-CM

## 2022-10-09 NOTE — Progress Notes (Signed)
Holly Larsen returns today 24 hours after bilateral breast reduction.  She is doing well with no specific complaints.  She states her pain is improving already.  She feels that her neck pain is significantly improved with the reduction in the size of her breast.  We discussed the difficulties in the operating room especially with her left nipple.  I explained to her that she required revision of the left breast to take pressure off of the pedicle due to congestion of the nipple.  On examination today the nipple is still modestly congested however it is much improved.  The nipple is warm and there is capillary refill.  On the rest of her exam she has extensive ecchymoses and her breasts are tight though I do not believe she has a hematoma.  Her breasts are reasonably symmetric.  The right nipple is well-perfused and healthy.  She reports minimal output from her drains last night.  Her husband is emptied them several times.  Drains were removed today without difficulty.  I discussed the importance of continued movement with the patient.  She may begin showering when she feels comfortable.  She should continue to wear her compressive garment.  She will follow-up with me next week.  She may return sooner for any questions or concerns.  Photographs of the left nipple were taken with her permission and placed in her chart.

## 2022-10-10 LAB — SURGICAL PATHOLOGY

## 2022-10-17 ENCOUNTER — Ambulatory Visit (INDEPENDENT_AMBULATORY_CARE_PROVIDER_SITE_OTHER): Payer: BC Managed Care – PPO | Admitting: Plastic Surgery

## 2022-10-17 VITALS — BP 138/81 | HR 84

## 2022-10-17 DIAGNOSIS — Z9889 Other specified postprocedural states: Secondary | ICD-10-CM

## 2022-10-17 NOTE — Progress Notes (Signed)
Holly Larsen returns today approximately 2 weeks postop from her bilateral breast reduction.  The left nipple is completely pink and well-perfused.  She has good feeling in both nipples.  There is still a moderate amount of ecchymoses bilaterally.  She has overall good shape and symmetry.  She is very pleased with the outcome.  On examination the incisions are clean dry and intact.  She may slowly increase her activity as tolerated.  She is requested to keep her scheduled follow-up appointment on September 25.

## 2022-10-25 DIAGNOSIS — E669 Obesity, unspecified: Secondary | ICD-10-CM | POA: Diagnosis not present

## 2022-10-25 DIAGNOSIS — Z0000 Encounter for general adult medical examination without abnormal findings: Secondary | ICD-10-CM | POA: Diagnosis not present

## 2022-10-25 DIAGNOSIS — E7800 Pure hypercholesterolemia, unspecified: Secondary | ICD-10-CM | POA: Diagnosis not present

## 2022-10-25 DIAGNOSIS — Z124 Encounter for screening for malignant neoplasm of cervix: Secondary | ICD-10-CM | POA: Diagnosis not present

## 2022-10-25 DIAGNOSIS — Z23 Encounter for immunization: Secondary | ICD-10-CM | POA: Diagnosis not present

## 2022-10-25 DIAGNOSIS — E039 Hypothyroidism, unspecified: Secondary | ICD-10-CM | POA: Diagnosis not present

## 2022-10-29 DIAGNOSIS — R7301 Impaired fasting glucose: Secondary | ICD-10-CM | POA: Diagnosis not present

## 2022-10-29 DIAGNOSIS — E039 Hypothyroidism, unspecified: Secondary | ICD-10-CM | POA: Diagnosis not present

## 2022-10-29 DIAGNOSIS — E7800 Pure hypercholesterolemia, unspecified: Secondary | ICD-10-CM | POA: Diagnosis not present

## 2022-10-29 NOTE — Progress Notes (Deleted)
Patient is a 62 year old female here for follow-up after bilateral breast reduction with Dr. Ladona Ridgel on 10/08/2022.  She is 3 weeks postop   Chaperone present on exam ***Bilateral NAC's are viable, bilateral breast incisions are intact. There is no erythema or cellulitic changes noted. No subcutaneous fluid collections noted with palpation.    Recommend continuing with compressive garment 24/7 until 6 weeks post-op,  avoiding strenuous activity/heavy lifting until 6 weeks post-op  Recommend following up in *** All the patient's questions were answered to their content. Recommend calling with any questions or concerns.

## 2022-10-30 ENCOUNTER — Encounter: Payer: BC Managed Care – PPO | Admitting: Surgical

## 2022-10-31 ENCOUNTER — Ambulatory Visit: Payer: BC Managed Care – PPO | Admitting: Behavioral Health

## 2022-10-31 DIAGNOSIS — F411 Generalized anxiety disorder: Secondary | ICD-10-CM

## 2022-10-31 DIAGNOSIS — F330 Major depressive disorder, recurrent, mild: Secondary | ICD-10-CM | POA: Diagnosis not present

## 2022-10-31 NOTE — Progress Notes (Signed)
                Holly Jewell L Farryn Linares, [name] 

## 2022-10-31 NOTE — Progress Notes (Signed)
[facility] Counselor/Therapist Progress Note  Patient ID: Holly Larsen, MRN: [id number],    Date: 10/31/2022  Time Spent: 55 min Caregility video; Pt is home in private & Provider working remotely from [employer]. Pt is aware of risks/limitations of telehealth & consents to Tx today. Time In: 3:00pm Time Out: 3:55pm   Treatment Type: Individual Therapy  Reported Symptoms: Pt admits she has "turned old"! Her Son is 62yo now & this makes her feel aged. She is exp'g anx/dep & stress due to life's circumstances.  Mental Status Exam: Appearance:  Casual     Behavior: Appropriate and Sharing  Motor: Normal  Speech/Language:  Clear and Coherent and Normal Rate  Affect: Appropriate  Mood: normal  Thought process: normal  Thought content:   WNL  Sensory/Perceptual disturbances:   WNL  Orientation: oriented to person, place, time/date, and situation  Attention: Good  Concentration: Good  Memory: WNL  Fund of knowledge:  Good  Insight:   Good  Judgment:  Good  Impulse Control: Good   Risk Assessment: Danger to Self:  No Self-injurious Behavior: No Danger to Others: No Duty to Warn:no Physical Aggression / Violence:No  Access to Firearms a concern: No  Gang Involvement:No   Subjective: Pt is examining her past & when she had her children. She was a young Mother of 22yo & had to quit [employer] due to feeling sick constantly. She sacrificed to create her Family; it was what she wanted her entire life. She wants to understand this New Generation better.   Pt has undergone her breast reduction surgery & healing well. She takes OTC pain medicine on a schedule in the am & @ 7-8pm nightly. She is hoping she will heal completely by Dec when she wants to do Aquatherapy. She has been fairly active & not house bond. She has inc'd confidence & motivation. She has lost 8# from the surgery.   She feels healthier & less weighted down. She is able to move more freely. Her comfort  levels have inc'd & her mood is much lighter. Her marital relationship has improved. Husb has been Dx'd w/ED. He has liver issues, DM, & weight issues. He also exp's cardiomegaly. His Hx of cardiac event in his 50's. His Hx of Px activity running kept him healthy for a long time. In the 1980's he indulged in cocaine & this impacted his cardiac health.   Husb [name] has been helpful, compassionate, & understanding. He has complimented her a great deal.   She is remembering her youth when she was reminded by other girls/women how large her breasts used to be-she is very grateful for her recent breast reduction surgery.  Interventions: Solution-Oriented/Positive Psychology and Family Systems  Diagnosis:Generalized anxiety disorder  Major depressive disorder, recurrent episode, mild (HCC)  Plan: [name] is abstaining from drinking ETOH & she feels much better. She did Carotid Artery Testing which included 4 different tests of cardiac health. She is caring for her health in more ways & feeling inc'd positivity. She is caring for her health in all ways. She will cont to use her Ins to care for her Px health as her Ins will care for more of the bill.  Target Date: 11/19/2022  Progress: 7  Frequency: Once every 2-3 wks  Modality: [name], [name]

## 2022-11-04 DIAGNOSIS — R0602 Shortness of breath: Secondary | ICD-10-CM | POA: Diagnosis not present

## 2022-11-04 DIAGNOSIS — Z9189 Other specified personal risk factors, not elsewhere classified: Secondary | ICD-10-CM | POA: Diagnosis not present

## 2022-11-04 DIAGNOSIS — R0683 Snoring: Secondary | ICD-10-CM | POA: Diagnosis not present

## 2022-11-04 DIAGNOSIS — F3289 Other specified depressive episodes: Secondary | ICD-10-CM | POA: Diagnosis not present

## 2022-11-04 DIAGNOSIS — R7303 Prediabetes: Secondary | ICD-10-CM | POA: Diagnosis not present

## 2022-11-06 ENCOUNTER — Encounter: Payer: BC Managed Care – PPO | Admitting: Surgical

## 2022-11-07 ENCOUNTER — Encounter: Payer: BC Managed Care – PPO | Admitting: Surgical

## 2022-11-14 ENCOUNTER — Ambulatory Visit: Payer: BC Managed Care – PPO | Admitting: Surgical

## 2022-11-14 DIAGNOSIS — Z9889 Other specified postprocedural states: Secondary | ICD-10-CM

## 2022-11-14 NOTE — Progress Notes (Signed)
62 year old female here for follow-up after bilateral breast reduction with Dr. Ladona Ridgel on 10/08/2022. She is about 5 weeks postop.  She reports overall she is doing well, she is not having any infectious symptoms.   She does not any specific complaints or concerns today.  She reports she is very happy with her outcome, reports her back pain has significantly improved.  Chaperone present on exam Bilateral NAC's are viable, bilateral breast incisions are intact. There is no erythema or cellulitic changes noted. No subcutaneous fluid collections noted with palpation. She does have some irritation of bilateral breasts, does not appear infectious.      A/P:  Recommend monitoring the redness.  We discussed stopping the current scar cream that she is using to see if that will be helpful, she may be having a reaction to this.  Recommend calling if she develops any infectious symptoms.  Pictures were obtained of the patient and placed in the chart with the patient's or guardian's permission.    Recommend continuing with compressive garment 24/7 until 6 weeks post-op,  avoiding strenuous activity/heavy lifting until 6 weeks post-op  Patient is scheduled to follow-up next week. All the patient's questions were answered to their content. Recommend calling with any questions or concerns.

## 2022-11-14 NOTE — Addendum Note (Signed)
Addended by: Drema Dallas K on: 11/14/2022 11:39 AM   Modules accepted: Orders

## 2022-11-21 ENCOUNTER — Encounter: Payer: BC Managed Care – PPO | Admitting: Surgical

## 2022-11-21 ENCOUNTER — Ambulatory Visit: Payer: BC Managed Care – PPO | Admitting: Behavioral Health

## 2022-11-27 ENCOUNTER — Encounter: Payer: BC Managed Care – PPO | Admitting: Surgical

## 2022-11-28 ENCOUNTER — Ambulatory Visit: Payer: BC Managed Care – PPO | Admitting: Behavioral Health

## 2022-11-28 DIAGNOSIS — F411 Generalized anxiety disorder: Secondary | ICD-10-CM | POA: Diagnosis not present

## 2022-11-28 DIAGNOSIS — F330 Major depressive disorder, recurrent, mild: Secondary | ICD-10-CM

## 2022-11-28 NOTE — Progress Notes (Addendum)
[facility] Counselor/Therapist Progress Note  Patient ID: Holly Larsen, MRN: [id number],    Date: 11/28/2022  Time Spent: 55 min Caregility video; Pt is home in private & Provider is working remotely from [employer]. Pt is aware of the risks/limitations of telehealth & consents to Tx today. Time In: 4:00pm Time Out: 4:55pm   Treatment Type: Individual Therapy  Reported Symptoms: Reduction in stress, anx/dep & worry for Mother post fall episode.  Mental Status Exam: Appearance:  Casual     Behavior: Appropriate and Sharing  Motor: Normal  Speech/Language:  Clear and Coherent  Affect: Appropriate  Mood: normal  Thought process: normal  Thought content:   WNL  Sensory/Perceptual disturbances:   WNL  Orientation: oriented to person, place, time/date, and situation  Attention: Good  Concentration: Good  Memory: WNL  Fund of knowledge:  Good  Insight:   Good  Judgment:  Good  Impulse Control: Good   Risk Assessment: Danger to Self:  No Self-injurious Behavior: No Danger to Others: No Duty to Warn:no Physical Aggression / Violence:No  Access to Firearms a concern: No  Gang Involvement:No   Subjective: Pt is trying to reduce her stress since Mother has been improving @ the [place]. She will be in Rehab for 3 more wks. She is still recooperating from her own breast reduction surgery. The care & visits for her surgery are almost done. She is feeling well & positive.  Pt's Str in [place] has broken her arm near her wrist & has a cast. She is communicating w/her Str re: their Mom. They did not attend to tell Mother, but it slipped out. Mother does not want any secrets in the Family.   Recent Bday was great. Her Paulino Rily came for it & they celebrated tgthr. It has been a good week. Husb [name] is doing better health-wise.  Str [name] is acting in her familiar way that is disrespectful & grandiose. She has disregarded her Mother while in the [place] after her fall. Str Romana Juniper  is visiting Mother regularly w/Pt.   Interventions: Family Systems  Diagnosis:Major depressive disorder, recurrent episode, mild (HCC)  Generalized anxiety disorder  Plan: Rhiann is moving ahead knowing her Str [name] is realistically grandiose & flamboyant. The Family is in disbelief @ times & wants to protect their Mother from the narrative. Family is heading in the right direction w/new revelations & knowledge. Christinna will cont to listen w/her revised ears.   Target Date: 01/04/2023  Progress: 7  Frequent: Once every 2-3 wks  Modality: [name], [name]

## 2022-11-28 NOTE — Progress Notes (Signed)
                Anastasya Jewell L Farryn Linares, [name] 

## 2022-11-29 ENCOUNTER — Other Ambulatory Visit: Payer: Self-pay | Admitting: *Deleted

## 2022-11-29 DIAGNOSIS — I8393 Asymptomatic varicose veins of bilateral lower extremities: Secondary | ICD-10-CM

## 2022-12-02 DIAGNOSIS — R944 Abnormal results of kidney function studies: Secondary | ICD-10-CM | POA: Diagnosis not present

## 2022-12-02 DIAGNOSIS — E66811 Obesity, class 1: Secondary | ICD-10-CM | POA: Diagnosis not present

## 2022-12-02 DIAGNOSIS — F418 Other specified anxiety disorders: Secondary | ICD-10-CM | POA: Diagnosis not present

## 2022-12-02 DIAGNOSIS — R7303 Prediabetes: Secondary | ICD-10-CM | POA: Diagnosis not present

## 2022-12-09 ENCOUNTER — Ambulatory Visit (HOSPITAL_COMMUNITY): Payer: BC Managed Care – PPO

## 2022-12-10 ENCOUNTER — Encounter: Payer: BC Managed Care – PPO | Admitting: Surgical

## 2022-12-16 ENCOUNTER — Encounter: Payer: Self-pay | Admitting: Surgical

## 2022-12-16 ENCOUNTER — Ambulatory Visit (INDEPENDENT_AMBULATORY_CARE_PROVIDER_SITE_OTHER): Payer: BC Managed Care – PPO | Admitting: Surgical

## 2022-12-16 VITALS — BP 133/80 | HR 82 | Ht 60.0 in | Wt 178.4 lb

## 2022-12-16 DIAGNOSIS — Z9889 Other specified postprocedural states: Secondary | ICD-10-CM

## 2022-12-16 NOTE — Progress Notes (Signed)
Patient is a  62 y.o.-year-old female status post bilateral breast reduction with Dr.  Ladona Ridgel on 10/08/2022.  Patient is 10 weeks postop.  She reports overall she is doing really well.  She has been helping her mother with medical care as she recently fell and broke her hip.  She reports she is doing much better now  In regards to her breast reduction surgery, she is overall doing really well.  She is overall very happy with her outcome.  She does have some questions about her lateral breast fullness, she has some dogears that are bothering her.  She reports it feels very full under the her arms when lowering them.  She is hopeful that this will improve with time as she reports family members have had breast reductions and noticed improvement over time.  Chaperone present on exam Bilateral NAC's are viable, bilateral breast incisions are intact. There is no erythema or cellulitic changes noted. No subcutaneous fluid collections noted with palpation.  She does have lateral breast fullness and some dogears present.   No that this time.  Patient can increase activity as she feels comfortable.  There is no signs infection or concern on exam.  Recommend following up in approximately 4 months for reevaluation and to discuss lateral breast with Dr. Ladona Ridgel if they have not improved. All the patient's questions were answered to their content. Recommend calling with any questions or concerns.  Pictures were obtained of the patient and placed in the chart with the patient's or guardian's permission.

## 2022-12-20 ENCOUNTER — Ambulatory Visit (INDEPENDENT_AMBULATORY_CARE_PROVIDER_SITE_OTHER): Payer: BC Managed Care – PPO | Admitting: Behavioral Health

## 2022-12-20 DIAGNOSIS — F411 Generalized anxiety disorder: Secondary | ICD-10-CM

## 2022-12-20 DIAGNOSIS — F330 Major depressive disorder, recurrent, mild: Secondary | ICD-10-CM

## 2022-12-20 NOTE — Progress Notes (Signed)
[facility] Counselor/Therapist Progress Note  Patient ID: FREDRICK MCBRYDE, MRN: [id number],    Date: 12/20/2022  Time Spent:  55 min Caregility video; Pt is home in private & Provider working remotely from [employer]. Pt is aware of risks/limitations of telehealth & consents to Tx today.  Time In: 12:00pm Time Out: 12:55pm   Treatment Type: Individual Therapy  Reported Symptoms: Pt is upset today by her Husb [name]. They are exp'g marital stress.   Mental Status Exam: Appearance:  Casual     Behavior: Appropriate and Sharing  Motor: Normal  Speech/Language:  Clear and Coherent  Affect: Appropriate  Mood: normal  Thought process: normal  Thought content:   WNL  Sensory/Perceptual disturbances:   WNL  Orientation: oriented to person, place, time/date, and situation  Attention: Good  Concentration: Good  Memory: WNL  Fund of knowledge:  Good  Insight:   Fair  Judgment:  Good  Impulse Control: Good   Risk Assessment: Danger to Self:  No Self-injurious Behavior: No Danger to Others: No Duty to Warn:no Physical Aggression / Violence:No  Access to Firearms a concern: No  Gang Involvement:No   Subjective: Pt is upset w/Husb today. He is not busy & they have too much time on their hands - this is causing issues. Pt is in [place] @ her Aunt's home to care for her Mother home since her broken hip episode. Pt has secured all the needed PT & OT therapy visits. She has also purchased the equipment needed for her hip. The situation makes her wake @ 6:00am. She has been caring for her Mother for a few wks.   Husb has been making good use of his time w/o Pt. He is cleaning & re-arranging furniture.   Pt's Mother is more independent now; she is going to the restroom & getting up for herself. Her pain meds are minimal. She will return to her own home in a few wks. Pt will cont to come & go once she returns to her own home.   Interventions: Solution-Oriented/Positive  Psychology, Psycho-education/Bibliotherapy, and Interpersonal  Diagnosis:Major depressive disorder, recurrent episode, mild (HCC)  Generalized anxiety disorder  Plan: Liya is hoping to celebrate Thanksgiving @ her Aunt's home in [place]. She is uncertain if her Str [name] will visit. She will do something else during that visit. She is skeptical of her Str's motivation & does not want to indulge in her dysfunction. She wants a forewarning from her Aunt so she can leave the house.   Target Date: 01/19/2023  Progress: 6  Frequency: Once every 2-3 wks  Modality: [name], [name]

## 2022-12-20 NOTE — Progress Notes (Signed)
                Holly Jewell L Farryn Linares, [name] 

## 2023-01-10 ENCOUNTER — Ambulatory Visit: Payer: BC Managed Care – PPO | Admitting: Behavioral Health

## 2023-01-10 DIAGNOSIS — F330 Major depressive disorder, recurrent, mild: Secondary | ICD-10-CM

## 2023-01-10 DIAGNOSIS — F411 Generalized anxiety disorder: Secondary | ICD-10-CM | POA: Diagnosis not present

## 2023-01-10 NOTE — Progress Notes (Signed)
[facility] Counselor/Therapist Progress Note  Patient ID: Holly Larsen, MRN: [id number],    Date: 01/10/2023  Time Spent: 55 min Caregility video; Pt is in her Mother's home in [place] post hip-surgery. Pt has privacy & Provider working remotely from [employer]. Pt is aware of the risks/limitations of telehealth & consents to Tx today. Time In: 10:00am Time Out: 10:55am  Treatment Type: Individual Therapy  Reported Symptoms: Elevated anx/dep & frustration over recent treatment by her Aunt resulting in her feeling silenced.  Mental Status Exam: Appearance:  Casual     Behavior: Appropriate and Sharing  Motor: Normal  Speech/Language:  Clear and Coherent  Affect: Appropriate  Mood: normal  Thought process: normal  Thought content:   WNL  Sensory/Perceptual disturbances:   WNL  Orientation: oriented to person, place, time/date, and situation  Attention: Good  Concentration: Good  Memory: WNL  Fund of knowledge:  Good  Insight:   Good  Judgment:  Good  Impulse Control: Good   Risk Assessment: Danger to Self:  No Self-injurious Behavior: No Danger to Others: No Duty to Warn:no Physical Aggression / Violence:No  Access to Firearms a concern: No  Gang Involvement:No   Subjective: Pt is dealing w/Family issues around the Holidays that concern her. Her Siblings are vocal about Pt's treatment by her Str Cathy. She feels inc'd respect from her Siblings & she has worked it out since she is caring for her Mother after hip replacement. Her past experience in home healthcare is serving her well now. She will be w/her Mother through Dec & into Jan as the Family adjusts to her inc'd needs.   Pt is trying to manage her emot'l health while she is w/her Mother & Aunt.   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 Her  Mother is making good progress. She is trying to speak w/her Husb Holly Larsen more often. He is c/o her absence from the marital home. She has realized she needs a break from him. She is trying to explore a new Church. She is advocating for herself more in the past few wks. She has noticed the inc in respect she is receiving from others. She is resistant to be a ppl-pleasing beh. She will cont to record in her Notebook the experiences during the Holidays.   Target Date: 02/03/2023  Progress: 7  Frequency: Once every 2-3 wks  Modality: Holly Fraise, [name]

## 2023-01-10 NOTE — Progress Notes (Signed)
                Holly Jewell L Farryn Linares, [name] 

## 2023-01-24 ENCOUNTER — Ambulatory Visit: Payer: BC Managed Care – PPO | Admitting: Behavioral Health

## 2023-01-24 DIAGNOSIS — F330 Major depressive disorder, recurrent, mild: Secondary | ICD-10-CM

## 2023-01-24 DIAGNOSIS — F411 Generalized anxiety disorder: Secondary | ICD-10-CM | POA: Diagnosis not present

## 2023-01-24 NOTE — Progress Notes (Signed)
 [facility] Counselor/Therapist Progress Note  Patient ID: Holly Larsen, MRN: [id number],    Date: 01/24/2023  Time Spent: 55 min Caregility video; Pt is in private & Provider working remotely from [employer]. Pt is aware of the risks/limitations of telehealth & consents to Tx today. Time In: 11:00am Time Out: 11:55am  Treatment Type: Individual Therapy  Reported Symptoms: Pt is upset over recent event w/her Mother's home renovations in the [place]. 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   Mental Status Exam: Appearance:  Casual     Behavior: Appropriate and Sharing  Motor: Normal  Speech/Language:  Clear and Coherent  Affect: Appropriate  Mood: angry and anxious  Thought process: normal  Thought content:   WNL  Sensory/Perceptual disturbances:   WNL  Orientation: oriented to person, place, time/date, and situation  Attention: Good  Concentration: Good  Memory: WNL  Fund of knowledge:  Good  Insight:   Good  Judgment:  Good  Impulse Control: Good   Risk Assessment: Danger to Self:  No Self-injurious Behavior: No Danger to Others: No Duty to Warn:no Physical Aggression / Violence:No  Access to Firearms a concern: No  Gang Involvement:No   Subjective: Pt is living in [place] temporarily while she is Caregiving for her Mother. Pt is trying to acclimate to the noise levels of the various clocks in her Aunt's Germaine Pomfret) home. They have been waking Pt btwn 5-6:00am. This is not her routine & she has been complying w/this wake time since she started visiting. Now she is resisting this time to wake & being firm about her own needs.   The interactions btwn Pt, Mother, & Aunt are often hilarious & Pt is trying to go w/the flow of the household. There have been several Ambulance visits for different reasons. She is trying to manage w/the two women in the home who monitor her too highly. This is getting under her skin.   Husb [name] is missing her highly.    Interventions: Family Systems  Diagnosis:Major depressive disorder, recurrent episode, mild (HCC)  Generalized anxiety disorder  Plan: Kierstynn is describing the needs of her Mother's health. In the past, Kalenna has hidden herself due to physical changes since breast reduction surgery. Now she is speaking up & being firm about her own needs. She is stepping lightly @ her Aunt's home while feeling micro-managed by her. Pt is being patient & humorous about it. She will cont to navigate this terrain through mid-Jan. She will make it a habit to record in her Notebook the details of her growth during this time. She will use this Notebook to record any future dreams.  Target Date: 03/06/2023  Progress: 6  Frequency: Once every 2-3 wks  Modality: [name], [name]

## 2023-01-24 NOTE — Progress Notes (Signed)
                Anastasya Jewell L Farryn Linares, [name] 

## 2023-02-04 ENCOUNTER — Encounter (HOSPITAL_COMMUNITY): Payer: Self-pay

## 2023-02-04 ENCOUNTER — Encounter: Payer: BC Managed Care – PPO | Admitting: Vascular Surgery

## 2023-02-04 ENCOUNTER — Encounter (HOSPITAL_COMMUNITY): Payer: BC Managed Care – PPO

## 2023-02-14 ENCOUNTER — Ambulatory Visit: Payer: BC Managed Care – PPO | Admitting: Behavioral Health

## 2023-02-14 DIAGNOSIS — F330 Major depressive disorder, recurrent, mild: Secondary | ICD-10-CM | POA: Diagnosis not present

## 2023-02-14 DIAGNOSIS — F4389 Other reactions to severe stress: Secondary | ICD-10-CM

## 2023-02-14 DIAGNOSIS — F411 Generalized anxiety disorder: Secondary | ICD-10-CM | POA: Diagnosis not present

## 2023-02-14 NOTE — Progress Notes (Signed)
 [facility] Counselor/Therapist Progress Note  Patient ID: Holly Larsen, MRN: [id number],    Date: 02/14/2023  Time Spent: 55 min Caregility video; Pt is @ Holly Larsen of Aunt until Mother can move into her new home in late Jan/early Feb. Pt is aware of the risks/limitations of telehealth & consents to Tx today. Time In: 10:00am Time Out: 10:55am  Treatment Type: Individual Therapy . Reported Symptoms: Reduction in anx/dep due to her adjustment to the situation w/her Holly Larsen & Mother  Mental Status Exam: Appearance:  Casual     Behavior: Appropriate and Sharing  Motor: Normal  Speech/Language:  Clear and Coherent  Affect: Appropriate  Mood: normal  Thought process: normal  Thought content:   WNL  Sensory/Perceptual disturbances:   WNL  Orientation: oriented to person, place, time/date, and situation  Attention: Good  Concentration: Good  Memory: WNL  Fund of knowledge:  Good  Insight:   Good  Judgment:  Good  Impulse Control: Good   Risk Assessment: Danger to Self:  No Self-injurious Behavior: No Danger to Others: No Duty to Warn:no Physical Aggression / Violence:No  Access to Firearms a concern: No  Gang Involvement:No   Subjective: Pt is dealing w/her Mother & Aunt in her Aunt's home. She will be there a few more wks prior to her Mother's return to her own home. The timing of this renovation is pivotal to her Mother's recovery.  Pt is taking her time & letting her Aunt work the schedule out. Husb is coming up on the 17th of Jan to see Pt & spend time tgthr.   Interventions: Family Systems  Diagnosis:Major depressive disorder, recurrent episode, mild (HCC)  Generalized anxiety disorder  Reaction to chronic stress  Plan: Holly Larsen is letting things play out for her Mother as the scenario unfolds. She realizes her Family appreciates her time w/her Mom & Aunt. Her second surgery on her breasts is in April. Her Husb is patient & tolerant of the  situation. He will visit for their Anniversary in mid Jan. She will cont to be flexible w/her Family.  Target Date: 03/22/2023  Progress: 6  Frequency: Once every 2-3 wks  Modality: Holly Richerd [name], [name]

## 2023-02-14 NOTE — Progress Notes (Signed)
   Deneise Lever, [name]

## 2023-03-07 ENCOUNTER — Ambulatory Visit (INDEPENDENT_AMBULATORY_CARE_PROVIDER_SITE_OTHER): Payer: BC Managed Care – PPO | Admitting: Behavioral Health

## 2023-03-07 DIAGNOSIS — F4389 Other reactions to severe stress: Secondary | ICD-10-CM

## 2023-03-07 DIAGNOSIS — F411 Generalized anxiety disorder: Secondary | ICD-10-CM | POA: Diagnosis not present

## 2023-03-07 NOTE — Progress Notes (Signed)
[facility] Counselor/Therapist Progress Note  Patient ID: Holly Larsen, MRN: [id number],    Date: 03/07/2023  Time Spent: 55 min Caregility video; Pt is home in private & Provider is working remotely from [employer]. Pt is aware of the risks/limitations of telehealth & consents to Tx today.  Time In: 11:00am Time Out: 11:55am   Treatment Type: Individual Therapy  Reported Symptoms: Reduction in anx/dep & stress since travelling back to her home w/Husb Holly Larsen  Mental Status Exam: Appearance:  Casual     Behavior: Appropriate and Sharing  Motor: Normal  Speech/Language:  Clear and Coherent  Affect: Appropriate  Mood: normal  Thought process: normal  Thought content:   WNL  Sensory/Perceptual disturbances:   WNL  Orientation: oriented to person, place, time/date, and situation  Attention: Good  Concentration: Good  Memory: WNL  Fund of knowledge:  Good  Insight:   Good  Judgment:  Good  Impulse Control: Good   Risk Assessment: Danger to Self:  No Self-injurious Behavior: No Danger to Others: No Duty to Warn:no Physical Aggression / Violence:No  Access to Firearms a concern: No  Gang Involvement:No   Subjective: Our Family has a female Hx of anxiety issues. Mother is using Xanax & the dose is fairly high (?). She will take half a tab & sleep for 6 hrs. Pt lft her Aunt's home due to Pt's perception of her Mother's overuse of her Xanax script. Pt will check this medication use w/her Mother's PCP or the alternate Prescriber. Pt insists her Mother needs to be transparent about her Rx use.   Pt confronted her Mother & Mother got very defensive about her administration of the medication. Pt is worried for the fall risk & other complications of the use of this medication. Str Sheldahl Sink is Mother's HCPOA & the Family has been discussing use of this medication for years. Pt & her Siblings are all frustrated w/the care of her Mother.   Pt celebrated her 27th Anniversary in  Jan. The Family ate out & then Pt wanted to leave for home. She has been busy @ home taking care of herself.   Str Holly Larsen [phone]) has rec'd a Dx of Stage I Breast Cancer. She will undergo Chemo w/no Rads in Feb. Pt wants to engage w/her soon in health & exercise.  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   Interventions: Grief Therapy, Psycho-education/Bibliotherapy, Insight-Oriented, and Family Systems  Diagnosis:Generalized anxiety disorder  Reaction to chronic stress  Plan: Holly Larsen will cont to record her thoughts in her Notebook; this has helped her thus far to manage her anxiety. She is c/o much concern re: her Mother's medications. She will secure an appt w/her PCP to discuss this.  Target Date: 04/05/2023  Progress: 7  Frequency: Once every 2-3 wks  Modality: Indiv Next session we will discuss Pt's relationship w/Husb & his ETOH use, as well as her lack of need for him to survive.  Deneise Lever, [name]

## 2023-03-07 NOTE — Progress Notes (Signed)
   Holly Lever, [name]

## 2023-03-19 ENCOUNTER — Encounter: Payer: BC Managed Care – PPO | Admitting: Plastic Surgery

## 2023-03-21 ENCOUNTER — Ambulatory Visit: Payer: BC Managed Care – PPO | Admitting: Behavioral Health

## 2023-03-27 ENCOUNTER — Telehealth: Payer: Self-pay | Admitting: Plastic Surgery

## 2023-03-27 ENCOUNTER — Ambulatory Visit: Payer: BC Managed Care – PPO | Admitting: Physician Assistant

## 2023-03-27 NOTE — Progress Notes (Deleted)
 Referring Provider Pahwani, Kasandra Knudsen, MD [address] [place],  [place] [address]   CC: No chief complaint on file.     Holly Larsen is an 63 y.o. female.  HPI: Patient is a pleasant 63 year old female s/p bilateral breast reduction performed 10/08/2022 by Dr. Ladona Ridgel who presents to clinic for follow-up.  She was last seen here in clinic on 12/16/2022.  At that time, she was quite pleased with the results of her breast reduction surgery.  She did have some lateral breast fullness and dogears present on exam.  Recommended follow-up in 4 months for reevaluation discussed lateral breasts with Dr. Ladona Ridgel if they have not yet improved with settling.    Today,  Allergies  Allergen Reactions   Aleve [Naproxen]    Morphine And Codeine    Rosuvastatin     Other Reaction(s): hot flashes/ muscle pain   Tylenol With Codeine #3 [Acetaminophen-Codeine]     Outpatient Encounter Medications as of 03/27/2023  Medication Sig   Acetylcysteine (NAC 600) 600 MG CAPS Take by mouth.   ALPHA-LIPOIC ACID PO Take 1,000 mg by mouth.   Ascorbic Acid (VITAMIN C) 1000 MG tablet Take 1,000 mg by mouth daily. With bioflavonoid   Calcium Carbonate (CALCIUM 600 PO) Take by mouth.   Cholecalciferol (VITAMIN D) 50 MCG (2000 UT) CAPS Take 1 capsule (2,000 Units total) by mouth daily.   Chromium 200 MCG CAPS Take by mouth.   escitalopram (LEXAPRO) 10 MG tablet Take 10 mg by mouth daily.   fenofibrate (TRICOR) 145 MG tablet Take 145 mg by mouth daily.   levothyroxine (SYNTHROID) 88 MCG tablet Take 88 mcg by mouth daily before breakfast.   magnesium 30 MG tablet Take 30 mg by mouth 2 (two) times daily.   Melatonin 5 MG CAPS Take by mouth.   Methylsulfonylmethane (MSM) 1000 MG TABS Take by mouth.   ondansetron (ZOFRAN) 4 MG tablet Take 1 tablet (4 mg total) by mouth every 8 (eight) hours as needed for nausea or vomiting.   QUERCETIN PO Take by mouth.   topiramate (TOPAMAX) 50 MG tablet Take 1 tablet  (50 mg total) by mouth daily.   No facility-administered encounter medications on file as of 03/27/2023.     Past Medical History:  Diagnosis Date   Anxiety    Arthritis    Back pain    Bilateral swelling of feet    Constipation    Depression    Fatigue    GERD (gastroesophageal reflux disease)    Hyperglycemia    Hyperlipidemia    Hypothyroidism    Joint pain    Thyroid disease    Vitamin D deficiency     Past Surgical History:  Procedure Laterality Date   BREAST REDUCTION SURGERY Bilateral 10/08/2022   Procedure: MAMMARY REDUCTION  (BREAST);  Surgeon: [name], MD;  Location: [facility];  Service: Plastics;  Laterality: Bilateral;   cataract surgery Left 2017   CHOLECYSTECTOMY     FOOT SURGERY Left 2016   rous-en-y     ROUX-EN-Y PROCEDURE      No family history on file.  Social History   Social History Narrative   Not on file     Review of Systems General: Denies fevers or chills Cardio: Denies chest pain Pulmonary: Denies difficulty breathing  Physical Exam    12/16/2022    3:02 PM 10/17/2022   11:08 AM 10/09/2022   10:09 AM  Vitals with BMI  Height 5\' 0"     Weight 178 lbs 6 oz    BMI 34.84    Systolic 133 138 865  Diastolic 80 81 68  Pulse 82 84 109    General:  No acute distress, nontoxic appearing  Respiratory: No increased work of breathing Neuro: Alert and oriented Psychiatric: Normal mood and affect   Assessment/Plan ***  Evelena Leyden PA-C 03/27/2023, 10:02 AM

## 2023-03-27 NOTE — Telephone Encounter (Signed)
 put with [name] Pebbles until we hear from the pt, called 03-27-23 lvmail

## 2023-04-09 ENCOUNTER — Ambulatory Visit: Payer: BC Managed Care – PPO | Admitting: Plastic Surgery

## 2023-04-09 VITALS — BP 134/83 | HR 66

## 2023-04-09 DIAGNOSIS — E65 Localized adiposity: Secondary | ICD-10-CM | POA: Diagnosis not present

## 2023-04-09 NOTE — Progress Notes (Signed)
 Ms. Stange underwent a bilateral breast reduction at the beginning of September 2024.  She has done very well from the reduction and has healed quite nicely.  Today she wants to discuss some fullness in the axillas.  On examination she has some adipose tissue with some small standing cone deformities in each axilla right greater than left.  We discussed that this is not actually breast tissue but subcutaneous adipose tissue.  Treatment of this is typically not covered by insurance and would likely require liposuction with a possible small skin excision.  She would like to work on weight loss first before we proceed with any type of surgical intervention I think this is appropriate.  She can follow-up with me at any time to further discuss this area that she is not happy with.

## 2023-04-11 ENCOUNTER — Ambulatory Visit: Payer: BC Managed Care – PPO | Admitting: Behavioral Health

## 2023-04-11 DIAGNOSIS — F330 Major depressive disorder, recurrent, mild: Secondary | ICD-10-CM | POA: Diagnosis not present

## 2023-04-11 DIAGNOSIS — F411 Generalized anxiety disorder: Secondary | ICD-10-CM

## 2023-04-11 DIAGNOSIS — F4389 Other reactions to severe stress: Secondary | ICD-10-CM

## 2023-04-11 NOTE — Progress Notes (Signed)
   Deneise Lever, [name]

## 2023-04-11 NOTE — Progress Notes (Addendum)
 [facility] Counselor/Therapist Progress Note  Patient ID: Holly Larsen, MRN: [id number],    Date: 04/11/2023  Time Spent: 55 min Caregility video; Pt is home in privated & Provider working remotely from [employer]. Pt is aware of the risks/limitations of telehealth & consents to Tx today.  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  Mental Status Exam: Appearance:  Casual     Behavior: Appropriate and Sharing  Motor: Normal  Speech/Language:  Clear and Coherent  Affect: Appropriate  Mood: normal  Thought process: normal  Thought content:   WNL  Sensory/Perceptual disturbances:   WNL  Orientation: oriented to person, place, time/date, and situation  Attention: Good  Concentration: Good  Memory: WNL  Fund of knowledge:  Good  Insight:   Good  Judgment:  Good  Impulse Control: Good   Risk Assessment: Danger to Self:  No Self-injurious Behavior: No Danger to Others: No Duty to Warn:no Physical Aggression / Violence:No  Access to Firearms a concern: No  Gang Involvement:No   Subjective: Pt is having multiple issues w/her jaw. She is arthritic bilaterally & exp'g pain. She is in Tx for this w/Holly Larsen, Holly Larsen. Her pain is heightened & the Tx using PRF (Protein Rich Fibrin) Tx.   Pt's Dtr has won a year of Tx travel to [place] for her brain tumors. It will be 11 trips over the course of a year for a week ea trip. She will be getting a specific vaccine Tx.   She is considering liposuction for the fatty deposits under her arms & near her breasts. Her Str is undergoing radiation for her breast cancer Dx post lumpectomy. They will be going to the [facility] to lift weights & do water aerobics to get in shape.   Pt is disgusted w/the current Administration in the [facility] about all the radical & unpredictable changes  that are being made. She wants to protest the Trump Admin in the future w/her Str.   Pt describes flashbacks from the past week.  Interventions: Psycho-education/Bibliotherapy and Insight-Oriented  Diagnosis:Generalized anxiety disorder  Reaction to chronic stress  Major depressive disorder, recurrent episode, mild (HCC)  Plan: Holly Larsen will cont to use her Notebook to record her thoughts, feelings, & anxieties. She will use this writing time to process her thoughts. She will call Holly Larsen's Office today to refill her Lexapro 10mg  or inc the dose w/her Sx of inc in sleep to 10-12 hrs daily, low interest, low appetite, & low energy.   Target Date: 05/20/2023  Progress: 5  Frequency: Once every 2-3 wks  Modality: Holly Larsen will use her Notebook to speak to [name] & we will do TIC & psychoedu next visit.   Deneise Lever, [name]

## 2023-04-15 DIAGNOSIS — R7303 Prediabetes: Secondary | ICD-10-CM | POA: Diagnosis not present

## 2023-04-15 DIAGNOSIS — E7800 Pure hypercholesterolemia, unspecified: Secondary | ICD-10-CM | POA: Diagnosis not present

## 2023-04-15 DIAGNOSIS — E66811 Obesity, class 1: Secondary | ICD-10-CM | POA: Diagnosis not present

## 2023-04-15 DIAGNOSIS — F418 Other specified anxiety disorders: Secondary | ICD-10-CM | POA: Diagnosis not present

## 2023-04-25 ENCOUNTER — Ambulatory Visit: Admitting: Behavioral Health

## 2023-04-25 DIAGNOSIS — F330 Major depressive disorder, recurrent, mild: Secondary | ICD-10-CM

## 2023-04-25 DIAGNOSIS — F4389 Other reactions to severe stress: Secondary | ICD-10-CM | POA: Diagnosis not present

## 2023-04-25 NOTE — Progress Notes (Signed)
   Deneise Lever, [name]

## 2023-04-25 NOTE — Progress Notes (Addendum)
 [facility] Counselor/Therapist Progress Note  Patient ID: Holly Larsen, MRN: [id number],    Date: 04/25/2023  Time Spent: 55 min Caregility video; Pt is home in private & Provider working remotely from [employer]. Pt is aware of the risks/limitations of telehealth & consents to Tx today.   Time In: 1:00pm Time Out: 1:55pm  Treatment Type: Individual Therapy  Reported Symptoms: Elevated anx/dep & stress due to upbringing remembrances today.   Mental Status Exam: Appearance:  Casual     Behavior: Appropriate and Sharing  Motor: Normal  Speech/Language:  Clear and Coherent  Affect: Appropriate  Mood: anxious  Thought process: normal  Thought content:   WNL  Sensory/Perceptual disturbances:   WNL  Orientation: oriented to person, place, time/date, and situation  Attention: Good  Concentration: Good  Memory: WNL  Fund of knowledge:  Good  Insight:   Good  Judgment:  Good  Impulse Control: Good   Risk Assessment: Danger to Self:  No Self-injurious Behavior: No Danger to Others: No Duty to Warn:no Physical Aggression / Violence:No  Access to Firearms a concern: No  Gang Involvement:No   Subjective: Pt is glad for her Husb today & appreciates him a lot. His Bday was a few days ago & he cares for her a lot. He is attentive & loves her-she appreciates his attitude.   Pt has gotten her new mouth guard. She is able to rest, talk, & sleep through the night now. She is grateful for the guard that has helped her to relax. She is completely doing better w/her oral health now. The new technology her Physician used is high tech & new to the general public.    Pt did not do what was suggested to Write a Letter to herself or to begin Journaling. She has hesitated due to fears of looking @ [name]). She has deep, strong feelings for this fragile self she held @ 63yo.  Pt hesitates to return to [place] where she risks indoctrination. She is very fearful of this  happening w/men who are Leaders in the [place]. She likes her time to herself & her own thoughts kept to herself & private.   She is trying to discover who she is in her life currently & wants to preserve her ability to control this for herself.   Pt has gotten her prescription refilled for Lexapro & the dose titrated upward to 20mg . It is already helping her & she has noticed positive changes.   Interventions: Insight-Oriented  Diagnosis:Major depressive disorder, recurrent episode, mild (HCC)  Reaction to chronic stress  Plan: Holly Larsen is trying to make sound decisions re: her life & direction. She feels she is trying to discover this for the third time in her life. She is examining her past Hx in her Family & does not want to reconnect w/the Church due to her fears of being controlled & dictated to by men/Elders in the [employer]. She feels freedom now. Holly Larsen will make an effort to write about her inner exp once she feels greater confidence in herself.  Target Date: 05/20/2023  Progress: 7  Frequency: Once every 2-3 wks  Modality: [name], [name]

## 2023-05-07 DIAGNOSIS — M26609 Unspecified temporomandibular joint disorder, unspecified side: Secondary | ICD-10-CM | POA: Diagnosis not present

## 2023-05-07 DIAGNOSIS — E7800 Pure hypercholesterolemia, unspecified: Secondary | ICD-10-CM | POA: Diagnosis not present

## 2023-05-07 DIAGNOSIS — R7303 Prediabetes: Secondary | ICD-10-CM | POA: Diagnosis not present

## 2023-05-07 DIAGNOSIS — E039 Hypothyroidism, unspecified: Secondary | ICD-10-CM | POA: Diagnosis not present

## 2023-05-14 ENCOUNTER — Ambulatory Visit: Payer: BC Managed Care – PPO | Admitting: Plastic Surgery

## 2023-05-16 ENCOUNTER — Ambulatory Visit (INDEPENDENT_AMBULATORY_CARE_PROVIDER_SITE_OTHER): Admitting: Behavioral Health

## 2023-05-16 DIAGNOSIS — F411 Generalized anxiety disorder: Secondary | ICD-10-CM

## 2023-05-16 NOTE — Progress Notes (Signed)
   Deneise Lever, [name]
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
## 2023-05-30 ENCOUNTER — Ambulatory Visit: Admitting: Behavioral Health

## 2023-06-13 ENCOUNTER — Ambulatory Visit: Admitting: Behavioral Health

## 2023-06-13 NOTE — Progress Notes (Unsigned)
   Holly Lever, [name]

## 2023-06-27 ENCOUNTER — Ambulatory Visit (INDEPENDENT_AMBULATORY_CARE_PROVIDER_SITE_OTHER): Admitting: Behavioral Health

## 2023-06-27 DIAGNOSIS — F411 Generalized anxiety disorder: Secondary | ICD-10-CM | POA: Diagnosis not present

## 2023-06-27 DIAGNOSIS — F330 Major depressive disorder, recurrent, mild: Secondary | ICD-10-CM | POA: Diagnosis not present

## 2023-06-27 NOTE — Progress Notes (Signed)
   Deneise Lever, [name]

## 2023-06-27 NOTE — Progress Notes (Signed)
  [facility] Counselor/Therapist Progress Note  Patient ID: Holly Larsen, MRN: [id number],    Date: 06/27/2023  Time Spent: 50 min Caregility video; Pt is home in private & Provider is working remotely from [employer]. Pt is aware of the risks/limitations of telehealth & consents to Tx today. Time In: 12:00pm Time Out: 12:50pm   Treatment Type: Individual Therapy  Reported Symptoms: Reduction in anx/dep & stress due to her post-surgery breast reduction  Mental Status Exam: Appearance:  Casual     Behavior: Appropriate and Sharing  Motor: Normal  Speech/Language:  Clear and Coherent  Affect: Appropriate  Mood: normal  Thought process: normal  Thought content:   WNL  Sensory/Perceptual disturbances:   WNL  Orientation: oriented to person, place, time/date, and situation  Attention: Good  Concentration: Good  Memory: WNL  Fund of knowledge:  Good  Insight:   Good  Judgment:  Good  Impulse Control: Good   Risk Assessment: Danger to Self:  No Self-injurious Behavior: No Danger to Others: No Duty to Warn:no Physical Aggression / Violence:No  Access to Firearms a concern: No  Gang Involvement:No   Subjective: Pt is trying to understand herself in new & improved ways. She has tried to feel better about herself, instead of putting herself last in all situations.   Pt's Husb is tending to his pre-DM status. They are more affectionate w/ea other. Husb is more fully engaged w/her Husb now. He is listening to her suggestions more often. Pt feels on the same page more often w/him than before. His Dx has motivated him after years of Pt telling him her own advice & encouragement. Husb has to come to his own conclusions-allow him this.   Growing up, Parents got upset over everyday accidents that occur naturally w/children. This estb'd that there were intentions w/these incidents & the infraction/natural mistake was malicious. This imposed shame is a negative message to  impart to children. Pt is seeking freedom & harmony in her faith belief system.  Interventions: Family Systems & Insight-Oriented  Diagnosis:Generalized anxiety disorder  Major depressive disorder, recurrent episode, mild (HCC)  Plan: Daianna will cont to care for herself in the future medically to enhance her health. She will consider that Providers need to be more patient w/her & have reasonable expectations.   Target Date: 08/04/2023  Progress: 6  Frequency: Once every 2-3 wks Modality: Deborrah Fam, [name]

## 2023-07-02 DIAGNOSIS — E7800 Pure hypercholesterolemia, unspecified: Secondary | ICD-10-CM | POA: Diagnosis not present

## 2023-07-02 DIAGNOSIS — R7303 Prediabetes: Secondary | ICD-10-CM | POA: Diagnosis not present

## 2023-07-02 DIAGNOSIS — M26609 Unspecified temporomandibular joint disorder, unspecified side: Secondary | ICD-10-CM | POA: Diagnosis not present

## 2023-07-02 DIAGNOSIS — F418 Other specified anxiety disorders: Secondary | ICD-10-CM | POA: Diagnosis not present

## 2023-07-18 ENCOUNTER — Ambulatory Visit: Admitting: Behavioral Health

## 2023-09-04 ENCOUNTER — Ambulatory Visit (INDEPENDENT_AMBULATORY_CARE_PROVIDER_SITE_OTHER): Admitting: Behavioral Health

## 2023-09-04 DIAGNOSIS — F330 Major depressive disorder, recurrent, mild: Secondary | ICD-10-CM | POA: Diagnosis not present

## 2023-09-04 DIAGNOSIS — F411 Generalized anxiety disorder: Secondary | ICD-10-CM

## 2023-09-04 NOTE — Progress Notes (Signed)
 [facility] Counselor/Therapist Progress Note  Patient ID: FLORIA BRANDAU, MRN: [id number],    Date: 09/04/2023  Time Spent: 50 min Caregility video; Pt is home in private & Provider working remotely from [employer]. Pt is aware of the risks/limitations of telehealth & consents to Tx today. Time In: 11:00am Time Out: 11:50am   Treatment Type: Individual Therapy  Reported Symptoms: Pt is feeling Px'ly sick today. She has an URI. Pt has returned from a trip to [place] to see her Dtr & care for cat while she was in [place].  Mental Status Exam: Appearance:  Casual     Behavior: Appropriate and Sharing  Motor: Normal  Speech/Language:  Clear and Coherent  Affect: Appropriate  Mood: normal  Thought process: normal  Thought content:   WNL  Sensory/Perceptual disturbances:   WNL  Orientation: oriented to person, place, time/date, and situation  Attention: Good  Concentration: Good  Memory: WNL  Fund of knowledge:  Good  Insight:   Good  Judgment:  Good  Impulse Control: Good   Risk Assessment: Danger to Self:  No Self-injurious Behavior: No Danger to Others: No Duty to Warn:no Physical Aggression / Violence:No  Access to Firearms a concern: No  Gang Involvement:No   Subjective: Pt has returned from [place]  trip. She acquired a cold & now feels fairly sick today. She was glad to visit, but glad to return home. Her Dtr & Dtr's Sig Other Devaughn are dealing w/her tumor. Pt & Dtr's Sig Other co-existed fine. Travelling back home was difficult & caused a good deal of anxiety. It is hard to be this self-reliant. Her independence during the trip was freeing.   Pt & Husb [name] are not getting along. While gone, she called & sked for a divorce. She had been drinking ETOH & smoking THC. She has apologized since for saying this to him. He has been woken up due to this call. He has changed since she has been home; being more understanding. Husb is a Pisces & holds some  characteristics of this Zodiac sign.    Interventions: Insight-Oriented  Diagnosis:Major depressive disorder, recurrent episode, mild (HCC)  Generalized anxiety disorder  Plan: Makenzi is growing & becoming more self-aware since she has been proactive about her own needs. Since telling her Husb they need a divorce, he has changed his beh & she is feeling more positive about the relationship. She has expressed the need for emot'l closeness. Husb [name] has responded accordingly.   Target Date: 09/19/2023  Progress: 7  Frequency: Once every 2-3 wks  Modality: [name], [name]

## 2023-09-10 DIAGNOSIS — E7800 Pure hypercholesterolemia, unspecified: Secondary | ICD-10-CM | POA: Diagnosis not present

## 2023-09-10 DIAGNOSIS — R7303 Prediabetes: Secondary | ICD-10-CM | POA: Diagnosis not present

## 2023-09-25 ENCOUNTER — Ambulatory Visit: Admitting: Behavioral Health

## 2023-09-25 DIAGNOSIS — F411 Generalized anxiety disorder: Secondary | ICD-10-CM

## 2023-09-25 DIAGNOSIS — F330 Major depressive disorder, recurrent, mild: Secondary | ICD-10-CM
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
## 2023-10-11 IMAGING — MG MM DIGITAL SCREENING BILAT W/ TOMO AND CAD
6 of 12 series · 6 of 36 positions shown · non-contrast
Comparison: Previous exam(s).

CLINICAL DATA: Screening.

EXAM:
DIGITAL SCREENING BILATERAL MAMMOGRAM WITH TOMOSYNTHESIS AND CAD
TECHNIQUE: Bilateral screening digital craniocaudal and mediolateral oblique
mammograms were obtained. Bilateral screening digital breast
tomosynthesis was performed. The images were evaluated with
computer-aided detection.

[L MLO synth-2D (1 of 2)]
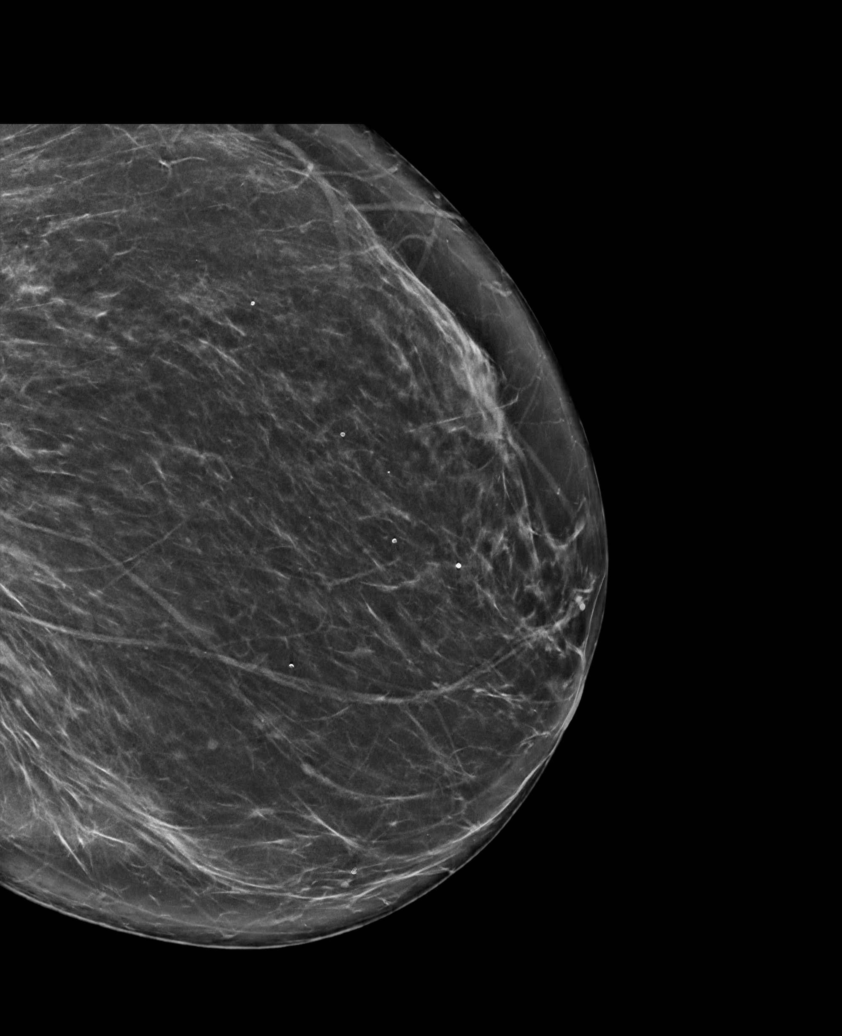

[R MLO synth-2D (1 of 2)]
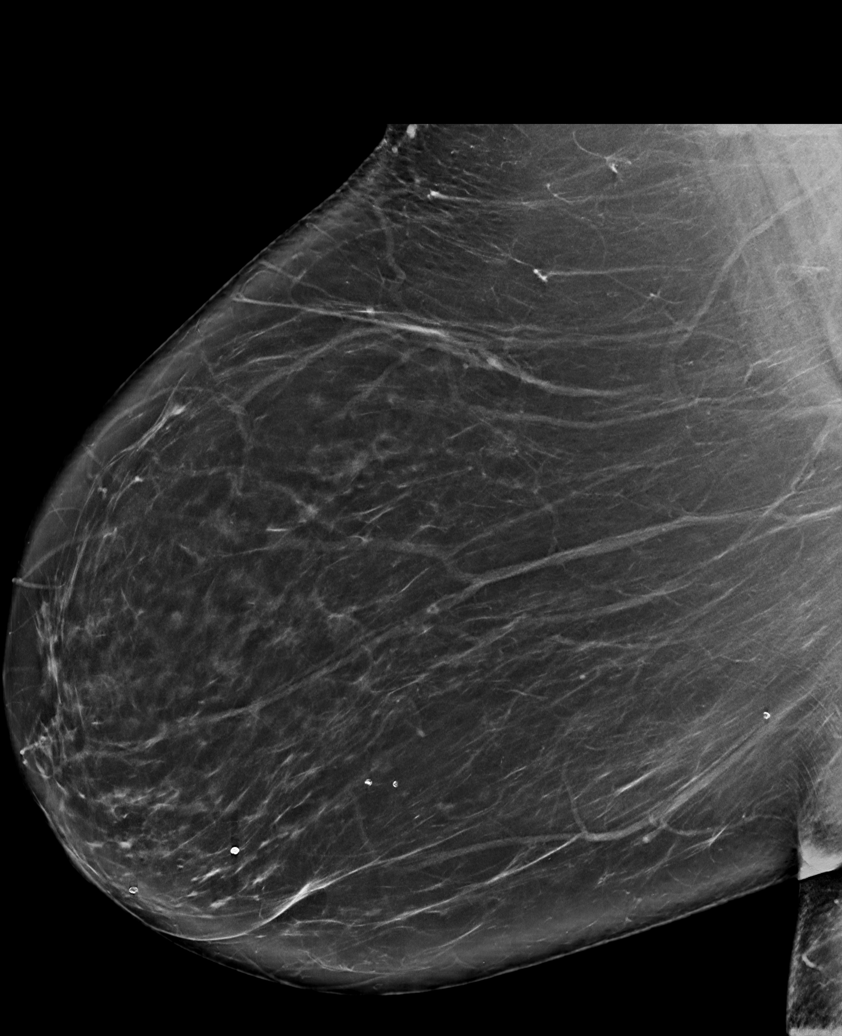

[L CC synth-2D]
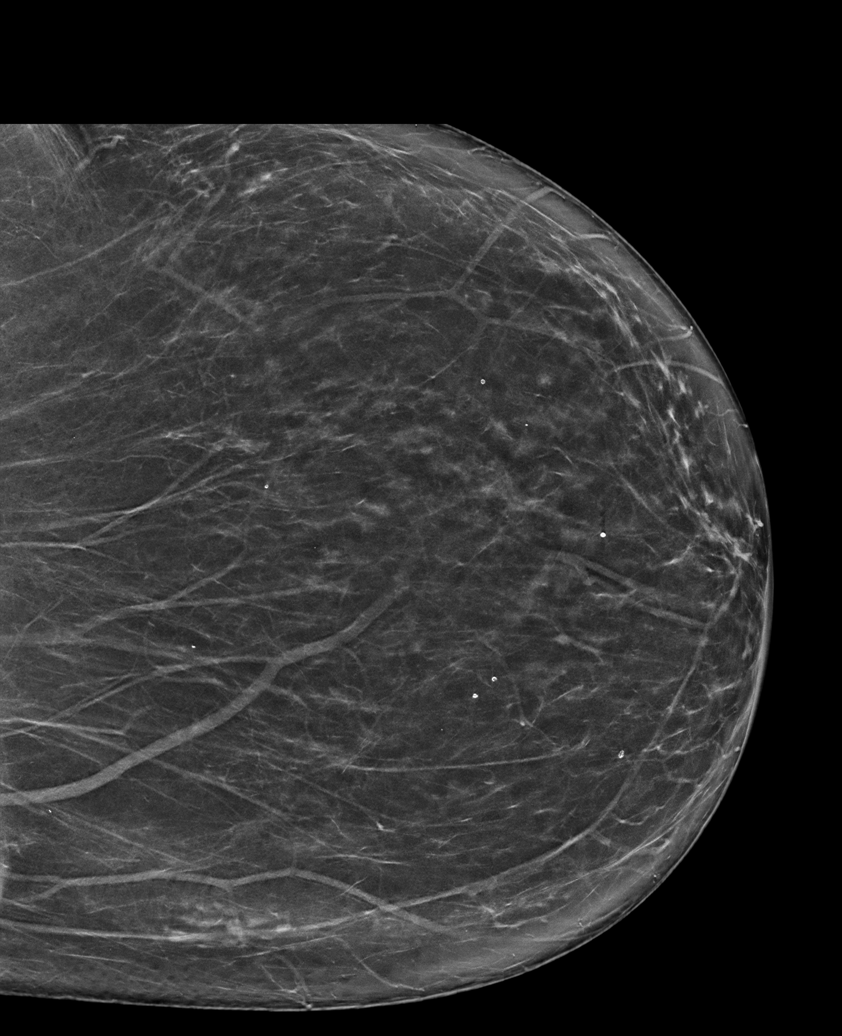

[R CC synth-2D]
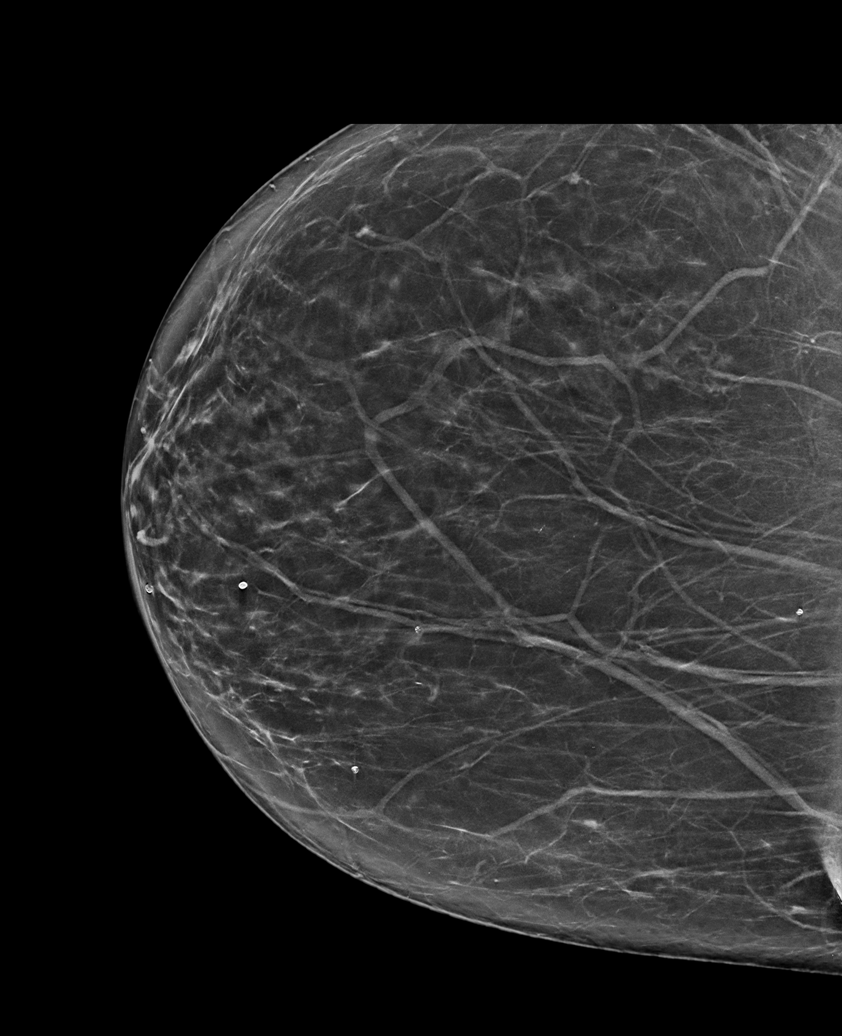

[R MLO synth-2D (2 of 2)]
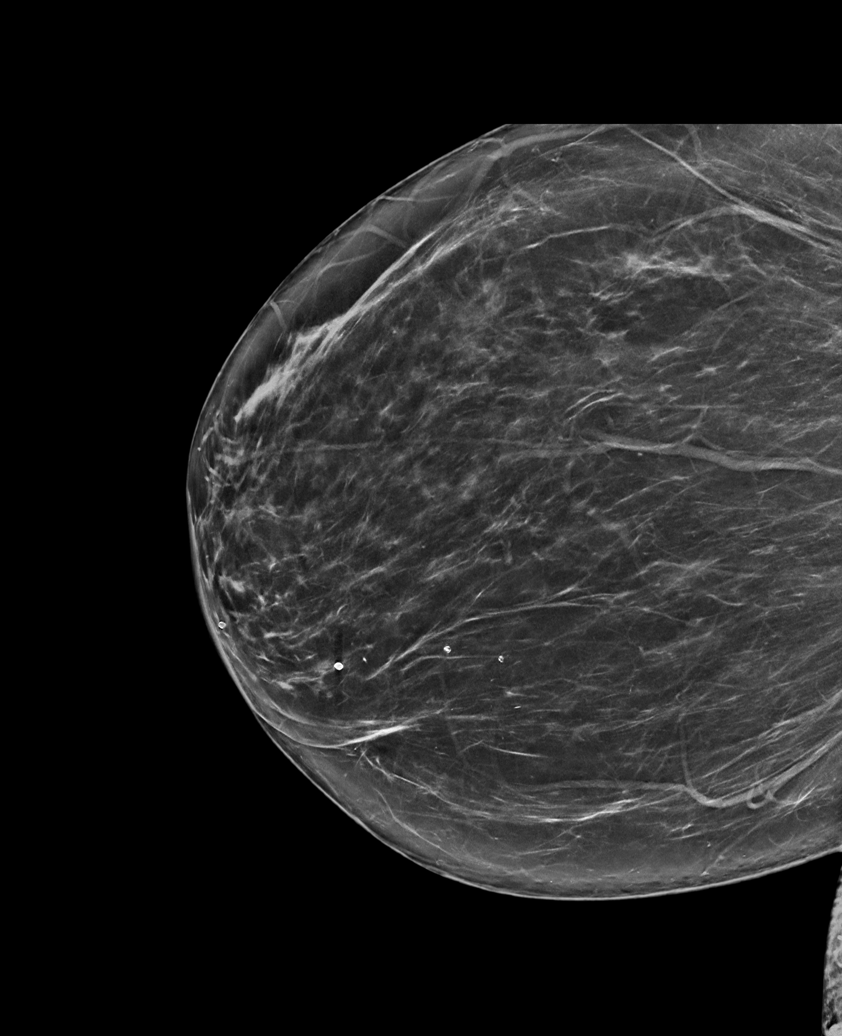

[L MLO synth-2D (2 of 2)]
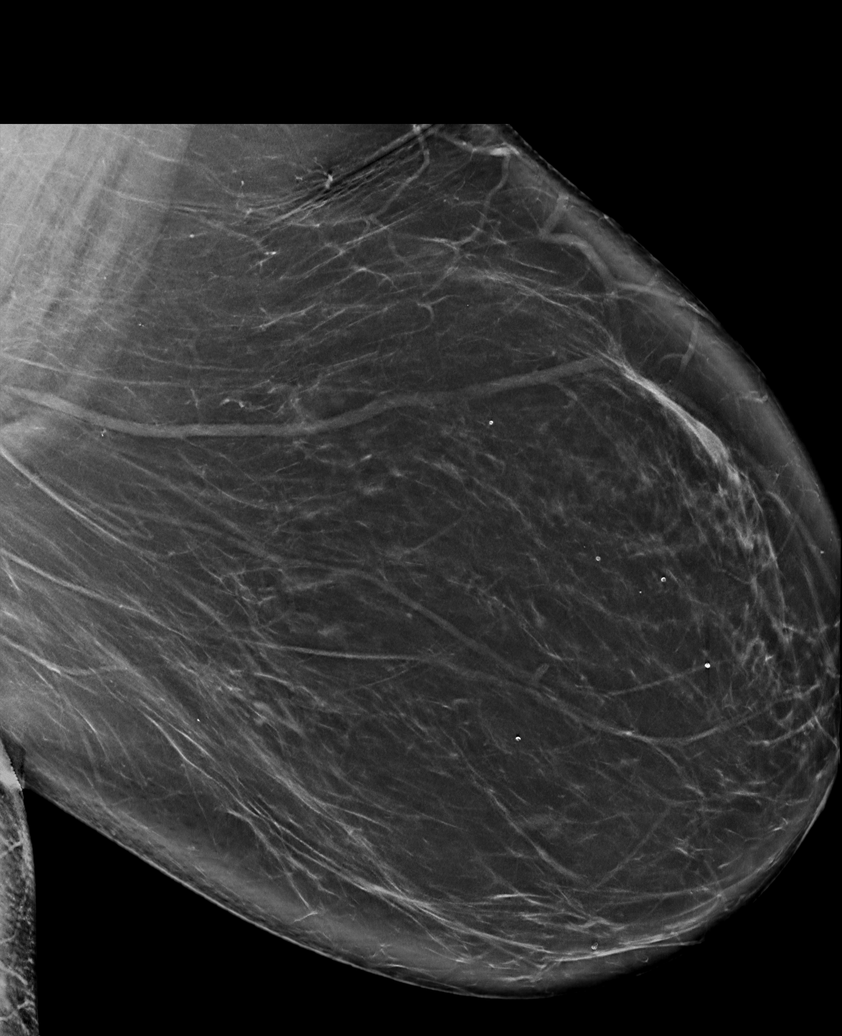

[6 of 36 positions shown; findings below may reference images not displayed]

ACR Breast Density Category b: There are scattered areas of
fibroglandular density.
FINDINGS: There are no findings suspicious for malignancy.
IMPRESSION: No mammographic evidence of malignancy. A result letter of this
screening mammogram will be mailed directly to the patient.

RECOMMENDATION:
Screening mammogram in one year. (Code:51-O-LD2)

BI-RADS CATEGORY  1: Negative.

## 2023-10-16 ENCOUNTER — Ambulatory Visit: Admitting: Behavioral Health

## 2023-10-17 ENCOUNTER — Ambulatory Visit (INDEPENDENT_AMBULATORY_CARE_PROVIDER_SITE_OTHER): Admitting: Behavioral Health

## 2023-10-17 DIAGNOSIS — F330 Major depressive disorder, recurrent, mild: Secondary | ICD-10-CM

## 2023-10-17 DIAGNOSIS — F411 Generalized anxiety disorder: Secondary | ICD-10-CM | POA: Diagnosis not present

## 2023-10-17 DIAGNOSIS — F4389 Other reactions to severe stress: Secondary | ICD-10-CM
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
## 2023-10-27 DIAGNOSIS — E7800 Pure hypercholesterolemia, unspecified: Secondary | ICD-10-CM | POA: Diagnosis not present

## 2023-10-27 DIAGNOSIS — F418 Other specified anxiety disorders: Secondary | ICD-10-CM | POA: Diagnosis not present

## 2023-10-27 DIAGNOSIS — Z9889 Other specified postprocedural states: Secondary | ICD-10-CM | POA: Diagnosis not present

## 2023-10-27 DIAGNOSIS — F4310 Post-traumatic stress disorder, unspecified: Secondary | ICD-10-CM | POA: Diagnosis not present

## 2023-10-27 DIAGNOSIS — R7301 Impaired fasting glucose: Secondary | ICD-10-CM | POA: Diagnosis not present

## 2023-10-27 DIAGNOSIS — Z0000 Encounter for general adult medical examination without abnormal findings: Secondary | ICD-10-CM | POA: Diagnosis not present

## 2023-10-27 DIAGNOSIS — E039 Hypothyroidism, unspecified: Secondary | ICD-10-CM | POA: Diagnosis not present

## 2023-10-27 DIAGNOSIS — Z23 Encounter for immunization: Secondary | ICD-10-CM | POA: Diagnosis not present

## 2023-10-27 DIAGNOSIS — I8390 Asymptomatic varicose veins of unspecified lower extremity: Secondary | ICD-10-CM | POA: Diagnosis not present

## 2023-10-29 ENCOUNTER — Other Ambulatory Visit: Payer: Self-pay | Admitting: Internal Medicine

## 2023-10-29 DIAGNOSIS — Z1231 Encounter for screening mammogram for malignant neoplasm of breast: Secondary | ICD-10-CM

## 2023-10-30 ENCOUNTER — Ambulatory Visit (INDEPENDENT_AMBULATORY_CARE_PROVIDER_SITE_OTHER): Admitting: Behavioral Health

## 2023-10-30 DIAGNOSIS — F4389 Other reactions to severe stress: Secondary | ICD-10-CM

## 2023-10-30 DIAGNOSIS — F330 Major depressive disorder, recurrent, mild: Secondary | ICD-10-CM

## 2023-10-30 DIAGNOSIS — F411 Generalized anxiety disorder: Secondary | ICD-10-CM

## 2023-10-30 NOTE — Progress Notes (Signed)
 [facility] Counselor/Therapist Progress Note  Patient ID: Holly Larsen, MRN: [id number],    Date: 10/30/2023  Time Spent: 50 min Caregility video; Pt has privacy & Provider is working remotely from [employer]. Pt is aware of the risks/limitations of telehealth & consents to Tx today.  Time In: 3:00pm Time Off: 3:50pm   Treatment Type: Individual Therapy  Reported Symptoms: Elevated anx/dep & stress due to Family dymanic issues  Mental Status Exam: Appearance:  Casual     Behavior: Appropriate and Sharing  Motor: Normal  Speech/Language:  Clear and Coherent  Affect: Appropriate  Mood: normal  Thought process: normal  Thought content:   WNL  Sensory/Perceptual disturbances:   WNL  Orientation: oriented to person, place, time/date, and situation  Attention: Good  Concentration: Good  Memory: WNL  Fund of knowledge:  Good  Insight:   Good  Judgment:  Good  Impulse Control: Good   Risk Assessment: Danger to Self:  No Self-injurious Behavior: No Danger to Others: No Duty to Warn:no Physical Aggression / Violence:No  Access to Firearms a concern: No  Gang Involvement:No   Subjective: Pt is examining her Px health & wants to improve her sense of self. She is focused on her on Px health in multiple ways to keep herself healthy.   Interventions: Psycho-education/Bibliotherapy and Insight-Oriented  Diagnosis:Generalized anxiety disorder  Major depressive disorder, recurrent episode, mild  Reaction to chronic stress  Plan: [name] is trying to be more healthy mentally. She is examining her life & trying to understand her life trajectory & take her life into her own hands. This will change things for the better.  Target Date: 11/19/2023  Progress:6  Frequency Once every 2-3wks:  Modality:Indiv  Richerd LITTIE Ling, [name]

## 2023-11-04 DIAGNOSIS — H04123 Dry eye syndrome of bilateral lacrimal glands: Secondary | ICD-10-CM | POA: Diagnosis not present

## 2023-11-04 DIAGNOSIS — H26491 Other secondary cataract, right eye: Secondary | ICD-10-CM | POA: Diagnosis not present

## 2023-11-04 DIAGNOSIS — Z961 Presence of intraocular lens: Secondary | ICD-10-CM | POA: Diagnosis not present

## 2023-11-04 DIAGNOSIS — H18413 Arcus senilis, bilateral: Secondary | ICD-10-CM | POA: Diagnosis not present

## 2023-11-05 DIAGNOSIS — R748 Abnormal levels of other serum enzymes: Secondary | ICD-10-CM | POA: Diagnosis not present

## 2023-11-05 DIAGNOSIS — F418 Other specified anxiety disorders: Secondary | ICD-10-CM | POA: Diagnosis not present

## 2023-11-05 DIAGNOSIS — R7303 Prediabetes: Secondary | ICD-10-CM | POA: Diagnosis not present

## 2023-11-05 DIAGNOSIS — E7800 Pure hypercholesterolemia, unspecified: Secondary | ICD-10-CM | POA: Diagnosis not present

## 2023-11-06 ENCOUNTER — Ambulatory Visit
Admission: RE | Admit: 2023-11-06 | Discharge: 2023-11-06 | Disposition: A | Discharge status: Home / Self Care | Source: Ambulatory Visit | Attending: Internal Medicine | Admitting: Internal Medicine

## 2023-11-06 DIAGNOSIS — Z1231 Encounter for screening mammogram for malignant neoplasm of breast: Secondary | ICD-10-CM

## 2023-11-12 ENCOUNTER — Ambulatory Visit: Admitting: Behavioral Health

## 2023-11-12 DIAGNOSIS — F411 Generalized anxiety disorder: Secondary | ICD-10-CM

## 2023-11-12 DIAGNOSIS — F330 Major depressive disorder, recurrent, mild: Secondary | ICD-10-CM | POA: Diagnosis not present
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
## 2023-11-13 DIAGNOSIS — R748 Abnormal levels of other serum enzymes: Secondary | ICD-10-CM | POA: Diagnosis not present

## 2023-11-24 ENCOUNTER — Other Ambulatory Visit: Payer: Self-pay | Admitting: Physician Assistant

## 2023-11-24 DIAGNOSIS — I8393 Asymptomatic varicose veins of bilateral lower extremities: Secondary | ICD-10-CM

## 2023-11-27 ENCOUNTER — Ambulatory Visit (INDEPENDENT_AMBULATORY_CARE_PROVIDER_SITE_OTHER): Admitting: Behavioral Health

## 2023-11-27 DIAGNOSIS — F411 Generalized anxiety disorder: Secondary | ICD-10-CM
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
## 2023-12-11 ENCOUNTER — Ambulatory Visit (INDEPENDENT_AMBULATORY_CARE_PROVIDER_SITE_OTHER): Admitting: Behavioral Health

## 2023-12-11 DIAGNOSIS — F330 Major depressive disorder, recurrent, mild: Secondary | ICD-10-CM | POA: Diagnosis not present

## 2023-12-11 DIAGNOSIS — F411 Generalized anxiety disorder: Secondary | ICD-10-CM | POA: Diagnosis not present
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
## 2023-12-12 DIAGNOSIS — E039 Hypothyroidism, unspecified: Secondary | ICD-10-CM | POA: Diagnosis not present

## 2023-12-15 DIAGNOSIS — Z09 Encounter for follow-up examination after completed treatment for conditions other than malignant neoplasm: Secondary | ICD-10-CM | POA: Diagnosis not present

## 2023-12-15 DIAGNOSIS — D125 Benign neoplasm of sigmoid colon: Secondary | ICD-10-CM | POA: Diagnosis not present

## 2023-12-15 DIAGNOSIS — Z860101 Personal history of adenomatous and serrated colon polyps: Secondary | ICD-10-CM | POA: Diagnosis not present

## 2023-12-15 DIAGNOSIS — K5730 Diverticulosis of large intestine without perforation or abscess without bleeding: Secondary | ICD-10-CM | POA: Diagnosis not present

## 2023-12-18 DIAGNOSIS — R748 Abnormal levels of other serum enzymes: Secondary | ICD-10-CM | POA: Diagnosis not present

## 2023-12-18 DIAGNOSIS — K760 Fatty (change of) liver, not elsewhere classified: Secondary | ICD-10-CM | POA: Diagnosis not present

## 2024-01-08 ENCOUNTER — Ambulatory Visit: Admitting: Behavioral Health

## 2024-01-08 DIAGNOSIS — F411 Generalized anxiety disorder: Secondary | ICD-10-CM | POA: Diagnosis not present

## 2024-01-08 DIAGNOSIS — F330 Major depressive disorder, recurrent, mild: Secondary | ICD-10-CM
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
## 2024-02-12 ENCOUNTER — Ambulatory Visit: Admitting: Behavioral Health

## 2024-02-20 ENCOUNTER — Ambulatory Visit (INDEPENDENT_AMBULATORY_CARE_PROVIDER_SITE_OTHER): Admitting: Behavioral Health

## 2024-02-20 DIAGNOSIS — F411 Generalized anxiety disorder: Secondary | ICD-10-CM

## 2024-02-20 DIAGNOSIS — F4389 Other reactions to severe stress: Secondary | ICD-10-CM | POA: Diagnosis not present

## 2024-02-20 NOTE — Progress Notes (Signed)
"    [facility] Counselor/Therapist Progress Note  Patient ID: Holly Larsen, MRN: [id number],    Date: 02/20/2024  Time Spent: 50 min Caregility video; Pt is home in private & Provider working remotely from [employer]. Pt is aware of the risks/limitations of telehealth & consents to Tx today. Time In: 11:00am Time Out: 11:50am   Treatment Type: Individual Therapy  Reported Symptoms: Some reduction in anx/dep Sx  Mental Status Exam: Appearance:  Casual     Behavior: Appropriate and Sharing  Motor: Normal  Speech/Language:  Clear and Coherent  Affect: Appropriate  Mood: normal  Thought process: normal  Thought content:   WNL  Sensory/Perceptual disturbances:   WNL  Orientation: oriented to person, place, time/date, and situation  Attention: Good  Concentration: Good  Memory: WNL  Fund of knowledge:  Good  Insight:   Good  Judgment:  Good  Impulse Control: Good   Risk Assessment: Danger to Self:  No Self-injurious Behavior: No Danger to Others: No Duty to Warn:no Physical Aggression / Violence:No  Access to Firearms a concern: No  Gang Involvement:No   Subjective: Pt is reviewing her goals today. She is aware of her Astrology & respects her tendencies.    Interventions: Family Systems  Diagnosis:Generalized anxiety disorder  Reaction to chronic stress  Plan: Holly Larsen is doing well today. Her Wedding Anniversary is tomorrow & she is being very realistic about her relationship. She has shown her humor today w/much irreverence. This asset is something Holly Larsen can mine more & share w/her [employer].  Target Date: 03/21/2024  Progress: 7  Frequency: Once every 2-3 wks  Modality: [name], [name]    "

## 2024-03-11 ENCOUNTER — Ambulatory Visit: Payer: Self-pay | Admitting: Behavioral Health

## 2024-03-11 DIAGNOSIS — F4389 Other reactions to severe stress: Secondary | ICD-10-CM

## 2024-03-11 DIAGNOSIS — F411 Generalized anxiety disorder: Secondary | ICD-10-CM

## 2024-03-11 NOTE — Progress Notes (Signed)
"    [facility] Counselor/Therapist Progress Note  Patient ID: Holly Larsen, MRN: [id number],    Date: 03/11/2024  Time Spent: 50 min Caregility video; Pt is home in private & Provider  working remotely from [employer]. Pt is aware of the risks/limitations of teleheatlth & consents to Tx today. Time In: 11:00am Time Out: 11:50am   Treatment Type: Individual Therapy  Reported Symptoms: Elevated anx/dep & stress due to weather conditions  Mental Status Exam: Appearance:  Casual and Neat     Behavior: Appropriate and Sharing  Motor: Normal  Speech/Language:  Clear and Coherent  Affect: Appropriate  Mood: anxious  Thought process: normal  Thought content:   WNL  Sensory/Perceptual disturbances:   WNL  Orientation: oriented to person, place, time/date, and situation  Attention: Good  Concentration: Good  Memory: WNL  Fund of knowledge:  Good  Insight:   Good  Judgment:  Good  Impulse Control: Good   Risk Assessment: Danger to Self:  No Self-injurious Behavior: No Danger to Others: No Duty to Warn:no Physical Aggression / Violence:No  Access to Firearms a concern: No  Gang Involvement:No   Subjective: Pt reports on the impact of the weather over the past 3 wks. Her Mother's health has been a top priority. She knows her Mother has a final say & wants to honor what her wishes.   Pt & Husb had an Anniversary celebration on 02/21/2024.   Interventions: Family Systems  Diagnosis:Generalized anxiety disorder  Reaction to chronic stress  Plan: Holly Larsen will try to keep her Mother's age & health status in mind as she traverses old age. Pt is considering how to negotiate her Mother's 91yo & her 84yo Aunt's health situation since late 2025. She feels on high alert for emergency calls. Holly Larsen will consider that Healthcare has been a changing arena. She will do her best w/the help of her Holly Larsen who is Mom's POA. Pt will explore the books in the public arena about  'Walking on Eggshells' by Holly Larsen & 'Stop Walking on Eggshells' by [place], [place] & Holly Larsen.   Target Date: 3/1/12026  Progress: 6  Frequency: Once every 2-3 wks  Modality: Holly Richerd [name], [name]    "

## 2024-05-06 ENCOUNTER — Ambulatory Visit (HOSPITAL_COMMUNITY)

## 2024-05-06 ENCOUNTER — Encounter: Admitting: Vascular Surgery
